# Patient Record
Sex: Male | Born: 1980 | Race: Black or African American | Hispanic: No | Marital: Married | Smoking: Current every day smoker
Health system: Southern US, Community
[De-identification: ages and names within clinical notes are randomized; demographics above are authoritative.]

## PROBLEM LIST (undated history)

## (undated) ENCOUNTER — Ambulatory Visit (HOSPITAL_COMMUNITY): Admission: EM | Payer: No Typology Code available for payment source | Source: Home / Self Care

## (undated) DIAGNOSIS — Z720 Tobacco use: Secondary | ICD-10-CM

## (undated) HISTORY — DX: Tobacco use: Z72.0

---

## 2008-01-04 ENCOUNTER — Emergency Department (HOSPITAL_COMMUNITY): Admission: EM | Admit: 2008-01-04 | Discharge: 2008-01-04 | Payer: Self-pay | Admitting: Emergency Medicine

## 2009-04-10 ENCOUNTER — Emergency Department (HOSPITAL_COMMUNITY): Admission: EM | Admit: 2009-04-10 | Discharge: 2009-04-10 | Payer: Self-pay | Admitting: Family Medicine

## 2009-09-26 ENCOUNTER — Emergency Department (HOSPITAL_COMMUNITY): Admission: EM | Admit: 2009-09-26 | Discharge: 2009-09-26 | Payer: Self-pay | Admitting: Family Medicine

## 2009-10-07 ENCOUNTER — Emergency Department (HOSPITAL_COMMUNITY): Admission: EM | Admit: 2009-10-07 | Discharge: 2009-10-08 | Payer: Self-pay | Admitting: Emergency Medicine

## 2009-12-27 ENCOUNTER — Emergency Department (HOSPITAL_COMMUNITY): Admission: EM | Admit: 2009-12-27 | Discharge: 2009-12-27 | Payer: Self-pay | Admitting: Family Medicine

## 2010-05-06 ENCOUNTER — Emergency Department (HOSPITAL_COMMUNITY): Admission: EM | Admit: 2010-05-06 | Discharge: 2010-05-06 | Payer: Self-pay | Admitting: Family Medicine

## 2010-07-11 ENCOUNTER — Emergency Department (HOSPITAL_COMMUNITY)
Admission: EM | Admit: 2010-07-11 | Discharge: 2010-07-11 | Payer: Self-pay | Source: Home / Self Care | Admitting: Emergency Medicine

## 2010-07-25 ENCOUNTER — Emergency Department (HOSPITAL_COMMUNITY)
Admission: EM | Admit: 2010-07-25 | Discharge: 2010-07-25 | Payer: Self-pay | Source: Home / Self Care | Admitting: Emergency Medicine

## 2010-07-25 LAB — RPR: RPR Ser Ql: NONREACTIVE

## 2010-07-25 LAB — HIV ANTIBODY (ROUTINE TESTING W REFLEX): HIV: NONREACTIVE

## 2010-08-03 ENCOUNTER — Emergency Department (HOSPITAL_COMMUNITY)
Admission: EM | Admit: 2010-08-03 | Discharge: 2010-08-03 | Payer: Self-pay | Source: Home / Self Care | Admitting: Emergency Medicine

## 2010-10-01 ENCOUNTER — Emergency Department (HOSPITAL_COMMUNITY)
Admission: EM | Admit: 2010-10-01 | Discharge: 2010-10-01 | Disposition: A | Discharge status: Home / Self Care | Payer: Self-pay | Attending: Emergency Medicine | Admitting: Emergency Medicine

## 2010-10-01 DIAGNOSIS — R109 Unspecified abdominal pain: Secondary | ICD-10-CM | POA: Insufficient documentation

## 2010-10-03 LAB — CULTURE, ROUTINE-ABSCESS

## 2010-10-08 LAB — RPR: RPR Ser Ql: NONREACTIVE

## 2010-10-08 LAB — HIV ANTIBODY (ROUTINE TESTING W REFLEX): HIV: NONREACTIVE

## 2010-10-08 LAB — GC/CHLAMYDIA PROBE AMP, GENITAL
Chlamydia, DNA Probe: NEGATIVE
GC Probe Amp, Genital: NEGATIVE

## 2010-10-14 LAB — COMPREHENSIVE METABOLIC PANEL
ALT: 12 U/L (ref 0–53)
AST: 16 U/L (ref 0–37)
Albumin: 4.2 g/dL (ref 3.5–5.2)
Alkaline Phosphatase: 43 U/L (ref 39–117)
BUN: 6 mg/dL (ref 6–23)
CO2: 26 mEq/L (ref 19–32)
Calcium: 9.2 mg/dL (ref 8.4–10.5)
Chloride: 104 mEq/L (ref 96–112)
Creatinine, Ser: 0.87 mg/dL (ref 0.4–1.5)
GFR calc Af Amer: >60 mL/min (ref >60)
GFR calc non Af Amer: >60 mL/min (ref >60)
Glucose, Bld: 94 mg/dL (ref 70–99)
Potassium: 3.8 mEq/L (ref 3.5–5.1)
Sodium: 139 mEq/L (ref 135–145)
Total Bilirubin: 0.8 mg/dL (ref 0.3–1.2)
Total Protein: 7.1 g/dL (ref 6.0–8.3)

## 2010-10-14 LAB — LIPASE, BLOOD: Lipase: 14 U/L (ref 11–59)

## 2010-10-14 LAB — POCT CARDIAC MARKERS
CKMB, poc: <1 ng/mL — ABNORMAL LOW (ref 1.0–8.0)
Myoglobin, poc: 44 ng/mL (ref 12–200)
Troponin i, poc: <0.05 ng/mL (ref 0.00–0.09)

## 2010-10-14 LAB — GC/CHLAMYDIA PROBE AMP, GENITAL
Chlamydia, DNA Probe: NEGATIVE
GC Probe Amp, Genital: POSITIVE — AB

## 2010-10-26 LAB — GC/CHLAMYDIA PROBE AMP, GENITAL
Chlamydia, DNA Probe: NEGATIVE
GC Probe Amp, Genital: NEGATIVE

## 2010-10-26 LAB — HIV ANTIBODY (ROUTINE TESTING W REFLEX): HIV: NONREACTIVE

## 2010-10-26 LAB — RPR: RPR Ser Ql: NONREACTIVE

## 2010-12-04 ENCOUNTER — Inpatient Hospital Stay (INDEPENDENT_AMBULATORY_CARE_PROVIDER_SITE_OTHER)
Admission: RE | Admit: 2010-12-04 | Discharge: 2010-12-04 | Disposition: A | Discharge status: Home / Self Care | Payer: Self-pay | Source: Ambulatory Visit | Attending: Family Medicine | Admitting: Family Medicine

## 2010-12-04 DIAGNOSIS — B353 Tinea pedis: Secondary | ICD-10-CM

## 2010-12-04 DIAGNOSIS — L989 Disorder of the skin and subcutaneous tissue, unspecified: Secondary | ICD-10-CM

## 2011-04-18 LAB — INFLUENZA A AND B ANTIGEN (CONVERTED LAB)
Inflenza A Ag: NEGATIVE
Influenza B Ag: NEGATIVE

## 2011-09-23 ENCOUNTER — Encounter (HOSPITAL_COMMUNITY): Payer: Self-pay

## 2011-09-23 ENCOUNTER — Emergency Department (INDEPENDENT_AMBULATORY_CARE_PROVIDER_SITE_OTHER)
Admission: EM | Admit: 2011-09-23 | Discharge: 2011-09-23 | Disposition: A | Discharge status: Home Health | Payer: Self-pay | Source: Home / Self Care | Attending: Emergency Medicine | Admitting: Emergency Medicine

## 2011-09-23 DIAGNOSIS — H0019 Chalazion unspecified eye, unspecified eyelid: Secondary | ICD-10-CM

## 2011-09-23 NOTE — ED Notes (Signed)
C/o swelling to rt upper eye lid since Nov 2012.  Denies pain, redness or drainage.  States both eyes were like this before and he used rx eye drops for conjunctivitis and sx resolved in lt eye.

## 2011-09-23 NOTE — Discharge Instructions (Signed)
Chalazion  A chalazion is a swelling or hard lump on the eyelid caused by a blocked oil gland. Chalazions may occur on the upper or the lower eyelid.    CAUSES    Oil gland in the eyelid becomes blocked.  SYMPTOMS     Swelling or hard lump on the eyelid. This lump may make it hard to see out of the eye.    The swelling may spread to areas around the eye.   TREATMENT     Although some chalazions disappear by themselves in 1 or 2 months, some chalazions may need to be removed.    Medicines to treat an infection may be required.   HOME CARE INSTRUCTIONS     Wash your hands often and dry them with a clean towel. Do not touch the chalazion.    Apply heat to the eyelid several times a day for 10 minutes to help ease discomfort and bring any yellowish white fluid (pus) to the surface. One way to apply heat to a chalazion is to use the handle of a metal spoon.    Hold the handle under hot water until it is hot, and then wrap the handle in paper towels so that the heat can come through without burning your skin.    Hold the wrapped handle against the chalazion and reheat the spoon handle as needed.    Apply heat in this fashion for 10 minutes, 4 times per day.    Return to your caregiver to have the pus removed if it does not break (rupture) on its own.    Do not try to remove the pus yourself by squeezing the chalazion or sticking it with a pin or needle.    Only take over-the-counter or prescription medicines for pain, discomfort, or fever as directed by your caregiver.   SEEK IMMEDIATE MEDICAL CARE IF:     You have pain in your eye.    Your vision changes.    The chalazion does not go away.    The chalazion becomes painful, red, or swollen, grows larger, or does not start to disappear after 2 weeks.   MAKE SURE YOU:     Understand these instructions.    Will watch your condition.    Will get help right away if you are not doing well or get worse.    Document Released: 07/05/2000 Document Revised: 06/27/2011 Document Reviewed: 10/23/2009  [employer] Patient Information 2012 [employer]

## 2011-09-23 NOTE — ED Provider Notes (Signed)
Chief Complaint  Patient presents with  . Eye Problem    History of Present Illness:   Edward Blackburn is a 31 year old male who has had a history of swelling and itching of the upper eyelids since last Thanksgiving. He went to an urgent care and was given some eyedrops. This seemed to get better, however he was left with a lump under his right upper lid. This is not painful or itchy he's had no drainage. His vision seems to be normal.  Review of Systems:  Other than noted above, the patient denies any of the following symptoms: Systemic:  No fever, chills, sweats, fatigue, or weight loss. Eye:  No redness, eye pain, photophobia, discharge, blurred vision, or diplopia. ENT:  No nasal congestion, rhinorrhea, or sore throat. Lymphatic:  No adenopathy. Skin:  No rash or pruritis.  PMFSH:  Past medical history, family history, social history, meds, and allergies were reviewed.  Physical Exam:   Vital signs:  BP 109/71  Pulse 60  Temp(Src) 98.2 F (36.8 C) (Oral)  Resp 16  SpO2 99% General:  Alert and in no distress. Eye:  He has a nodule under his right upper eyelid. There is no inflammation, redness, tenderness, or drainage. The eyeball itself is normal, conjunctiva shows no injection, no drainage, cornea is normal, anterior chambers normal, he has full EOMs, and funduscopic exam is normal.is normal.  ENT:  TMs and canals clear.  Nasal mucosa normal.  No intra-oral lesions, mucous membranes moist, pharynx clear. Neck:  No adenopathy tenderness or mass. Skin:  Clear, warm and dry.  Assessment:   Diagnoses that have been ruled out:  None  Diagnoses that are still under consideration:  None  Final diagnoses:  Chalazion    Plan:   1.  The following meds were prescribed:   New Prescriptions   No medications on file   2.  The patient was instructed in symptomatic care and handouts were given. 3.  The patient was told to return if becoming worse in any way, if no better in 3 or 4 days,  and given some red flag symptoms that would indicate earlier return.  Follow up:  The patient was told to follow up with Dr. Gwen Pounds for definitive treatment. In the meantime he can use moist warm compresses.      Roque Lias, MD 09/23/11 [phone]

## 2014-04-21 ENCOUNTER — Encounter (HOSPITAL_COMMUNITY): Payer: Self-pay | Admitting: Emergency Medicine

## 2014-04-21 ENCOUNTER — Emergency Department (INDEPENDENT_AMBULATORY_CARE_PROVIDER_SITE_OTHER)
Admission: EM | Admit: 2014-04-21 | Discharge: 2014-04-21 | Disposition: A | Discharge status: Home / Self Care | Payer: Self-pay | Source: Home / Self Care

## 2014-04-21 DIAGNOSIS — B353 Tinea pedis: Secondary | ICD-10-CM

## 2014-04-21 MED ORDER — KETOCONAZOLE 2 % EX CREA: 1.0000 "application " | TOPICAL_CREAM | Freq: Every day | CUTANEOUS | Status: DC

## 2014-04-21 MED ORDER — TERBINAFINE HCL 250 MG PO TABS: 250.0000 mg | ORAL_TABLET | Freq: Every day | ORAL | Status: DC

## 2014-04-21 NOTE — ED Provider Notes (Signed)
CSN: [id number]     Arrival date & time 04/21/14  1712 History   None    Chief Complaint  Patient presents with  . Foot Pain    bilateral irritation and mild pain   (Consider location/radiation/quality/duration/timing/severity/associated sxs/prior Treatment) Patient is a [age] y.o. male presenting with lower extremity pain and rash.  Foot Pain  Rash Location:  Foot Foot rash location:  R foot and L foot Quality: blistering, dryness, itchiness, peeling and scaling   Severity:  Mild Duration:  2 months Chronicity:  New Context comment:  Feet constantly  wet otj of construction,  Ineffective treatments:  Anti-fungal cream Associated symptoms: no fever     History reviewed. No pertinent past medical history. History reviewed. No pertinent past surgical history. History reviewed. No pertinent family history. History  Substance Use Topics  . Smoking status: Current Every Day Smoker  . Smokeless tobacco: Not on file  . Alcohol Use: Yes    Review of Systems  Constitutional: Negative.  Negative for fever.  Skin: Positive for rash.    Allergies  Review of patient's allergies indicates no known allergies.  Home Medications   Prior to Admission medications   Medication Sig Start Date End Date Taking? Authorizing Provider  ketoconazole (NIZORAL) 2 % cream Apply 1 application topically daily. At bedtime. 04/21/14   Linna HoffJames D Kindl, MD  terbinafine (LAMISIL) 250 MG tablet Take 1 tablet (250 mg total) by mouth daily. 04/21/14   Linna HoffJames D Kindl, MD   BP 134/82  Pulse 68  Temp(Src) 98.8 F (37.1 C) (Oral)  Resp 16  SpO2 98% Physical Exam  Nursing note and vitals reviewed. Constitutional: He is oriented to person, place, and time. He appears well-developed and well-nourished.  Neurological: He is alert and oriented to person, place, and time.  Skin: Skin is warm and dry. Rash noted.  Peeling dermatitis of both feet .    ED Course  Procedures (including critical care time) Labs  Review Labs Reviewed - No data to display  Imaging Review No results found.   MDM   1. Tinea pedis of both feet        Linna HoffJames D Kindl, MD 04/21/14 [id number]

## 2014-04-21 NOTE — ED Notes (Signed)
Pt soaking feet will discharge at 6:30 p.m.  Mw, cma

## 2014-04-21 NOTE — ED Notes (Signed)
Reports bilateral foot irritation and pain.  States " at work my feet stay wet constantly.  Having peeling of skin, irritation and mild pain".  No relief with foot cream.

## 2015-07-23 ENCOUNTER — Emergency Department (HOSPITAL_COMMUNITY): Payer: Self-pay

## 2015-07-23 ENCOUNTER — Emergency Department (HOSPITAL_COMMUNITY)
Admission: EM | Admit: 2015-07-23 | Discharge: 2015-07-23 | Disposition: A | Discharge status: Home / Self Care | Payer: Self-pay | Attending: Emergency Medicine | Admitting: Emergency Medicine

## 2015-07-23 ENCOUNTER — Encounter (HOSPITAL_COMMUNITY): Payer: Self-pay | Admitting: Nurse Practitioner

## 2015-07-23 DIAGNOSIS — J111 Influenza due to unidentified influenza virus with other respiratory manifestations: Secondary | ICD-10-CM | POA: Insufficient documentation

## 2015-07-23 DIAGNOSIS — R69 Illness, unspecified: Secondary | ICD-10-CM

## 2015-07-23 DIAGNOSIS — F17200 Nicotine dependence, unspecified, uncomplicated: Secondary | ICD-10-CM | POA: Insufficient documentation

## 2015-07-23 DIAGNOSIS — Z79899 Other long term (current) drug therapy: Secondary | ICD-10-CM | POA: Insufficient documentation

## 2015-07-23 MED ORDER — HYDROCODONE-ACETAMINOPHEN 5-325 MG PO TABS: ORAL_TABLET | ORAL | Status: DC

## 2015-07-23 MED ORDER — ACETAMINOPHEN 325 MG PO TABS
650.0000 mg | ORAL_TABLET | Freq: Once | ORAL | Status: AC | PRN
  Administered 2015-07-23: 650 mg via ORAL

## 2015-07-23 MED ORDER — ACETAMINOPHEN 325 MG PO TABS
ORAL_TABLET | ORAL | Status: AC
  Filled 2015-07-23: qty 2

## 2015-07-23 NOTE — ED Notes (Addendum)
He c/o cough, chest and nasal congestion, body aches and chills increasingly worse over past couple days. Tried delsym with minimal relief. He is A&Ox4 and breathing easily

## 2015-07-23 NOTE — ED Provider Notes (Signed)
CSN: [id number]     Arrival date & time 07/23/15  1849 History   First MD Initiated Contact with Patient 07/23/15 1913     Chief Complaint  Patient presents with  . URI     (Consider location/radiation/quality/duration/timing/severity/associated sxs/prior Treatment) HPI   Blood pressure 135/76, pulse 91, temperature 99.6 F (37.6 C), temperature source Oral, resp. rate 18, height 6' (1.829 m), weight 92.216 kg, SpO2 98 %.  Edward Blackburn is a 35 y.o. male complaining of intermittently productive cough, nasal congestion, myalgia, sore throat, nausea with no emesis. Started 2 days ago. Patient denies focal chest pain, shortness of breath, abdominal pain, change in bowel or bladder habits. On review of systems he endorses chills and decreased by mouth intake. Has not had a flu shot this year. He denies history of diabetes, asthma.   History reviewed. No pertinent past medical history. History reviewed. No pertinent past surgical history. History reviewed. No pertinent family history. Social History  Substance Use Topics  . Smoking status: Current Every Day Smoker  . Smokeless tobacco: None  . Alcohol Use: Yes    Review of Systems  10 systems reviewed and found to be negative, except as noted in the HPI.   Allergies  Review of patient's allergies indicates no known allergies.  Home Medications   Prior to Admission medications   Medication Sig Start Date End Date Taking? Authorizing Provider  HYDROcodone-acetaminophen (NORCO/VICODIN) 5-325 MG tablet Take 1-2 tablets by mouth every 6 hours as needed for pain and/or cough. 07/23/15   Lyal Husted, PA-C  ketoconazole (NIZORAL) 2 % cream Apply 1 application topically daily. At bedtime. 04/21/14   Linna HoffJames D Kindl, MD  terbinafine (LAMISIL) 250 MG tablet Take 1 tablet (250 mg total) by mouth daily. 04/21/14   Linna HoffJames D Kindl, MD   BP 135/76 mmHg  Pulse 91  Temp(Src) 99.6 F (37.6 C) (Oral)  Resp 18  Ht 6' (1.829 m)  Wt 92.216 kg  BMI  27.57 kg/m2  SpO2 98% Physical Exam  Constitutional: He is oriented to person, place, and time. He appears well-developed and well-nourished. No distress.  HENT:  Head: Normocephalic and atraumatic.  Mouth/Throat: Oropharynx is clear and moist.  No drooling or stridor. Posterior pharynx mildly erythematous no significant tonsillar hypertrophy. No exudate. Soft palate rises symmetrically. No TTP or induration under tongue.   No tenderness to palpation of frontal or bilateral maxillary sinuses.  No mucosal edema in the nares.  Bilateral tympanic membranes with normal architecture and good light reflex.    Eyes: Conjunctivae and EOM are normal. Pupils are equal, round, and reactive to light.  Neck: Normal range of motion.  Cardiovascular: Normal rate, regular rhythm and intact distal pulses.   Pulmonary/Chest: Effort normal and breath sounds normal.  Abdominal: Soft. There is no tenderness.  Musculoskeletal: Normal range of motion.  Neurological: He is alert and oriented to person, place, and time.  Skin: He is not diaphoretic.  Psychiatric: He has a normal mood and affect.  Nursing note and vitals reviewed.   ED Course  Procedures (including critical care time) Labs Review Labs Reviewed - No data to display  Imaging Review Dg Chest 2 View  07/23/2015  CLINICAL DATA:  Cough and fever. Left-sided chest pain and shortness of breath. Symptoms for 1 day. EXAM: CHEST  2 VIEW COMPARISON:  10/07/2009 FINDINGS: Right basilar scarring is unchanged from prior exam. The cardiomediastinal contours are normal. Pulmonary vasculature is normal. No consolidation, pleural effusion, or pneumothorax. No acute  osseous abnormalities are seen. IMPRESSION: No acute pulmonary process. Electronically Signed   By: Rubye Oaks M.D.   On: 07/23/2015 19:54   I have personally reviewed and evaluated these images and lab results as part of my medical decision-making.   EKG Interpretation None      MDM    Final diagnoses:  Influenza-like illness    Filed Vitals:   07/23/15 1900 07/23/15 1903 07/23/15 2014  BP:  117/76 135/76  Pulse:  103 91  Temp:  101.2 F (38.4 C) 99.6 F (37.6 C)  TempSrc:  Oral Oral  Resp:  16 18  Height: 6' (1.829 m)    Weight: 92.216 kg    SpO2:  95% 98%    Medications  acetaminophen (TYLENOL) 325 MG tablet (not administered)  acetaminophen (TYLENOL) tablet 650 mg (650 mg Oral Given 07/23/15 1906)    Edward Blackburn is 35 y.o. male presenting with fever, chills, cough, myalgia, sore throat. Patient is febrile to 100.1, mild tachycardia of 103. He appears clinically well-hydrated. He is not vomiting. Chest x-rays without infiltrate. We have discussed the risks and benefits of Tamiflu and in shared decision-making he declines. Advised patient aggressive hydration, Vicodin for pain control and cough suppression. Discussed return precautions and how to administer antipyretics. Work note provided.  Evaluation does not show pathology that would require ongoing emergent intervention or inpatient treatment. Pt is hemodynamically stable and mentating appropriately. Discussed findings and plan with patient/guardian, who agrees with care plan. All questions answered. Return precautions discussed and outpatient follow up given.   Discharge Medication List as of 07/23/2015  8:12 PM    START taking these medications   Details  HYDROcodone-acetaminophen (NORCO/VICODIN) 5-325 MG tablet Take 1-2 tablets by mouth every 6 hours as needed for pain and/or cough., [employer], PA-C 07/23/15 1610  Derwood Kaplan, MD 07/26/15 [phone]

## 2015-07-23 NOTE — ED Notes (Signed)
Patient transported to X-ray 

## 2015-07-23 NOTE — Discharge Instructions (Signed)
Return to the emergency room for any worsening or concerning symptoms including fast breathing, heart racing, confusion, vomiting.  Rest, cover your mouth when you cough and wash your hands frequently.   Push fluids: water or Gatorade, do not drink any soda, juice or caffeinated beverages.  For fever and pain control you can take Motrin (ibuprofen) as follows: 400 mg (this is normally 2 over the counter pills) every 4 hours with food.  Do not return to work until 2 day after your fever breaks.   Take Vicodin for cough and pain control, do not drink alcohol, drive, care for children or do other critical tasks while taking Vicodin     Do not hesitate to return to the emergency room for any new, worsening or concerning symptoms.  Please obtain primary care using resource guide below. Let them know that you were seen in the emergency room and that they will need to obtain records for further outpatient management.   Influenza, Adult Influenza ("the flu") is a viral infection of the respiratory tract. It occurs more often in winter months because people spend more time in close contact with one another. Influenza can make you feel very sick. Influenza easily spreads from person to person (contagious). CAUSES  Influenza is caused by a virus that infects the respiratory tract. You can catch the virus by breathing in droplets from an infected person's cough or sneeze. You can also catch the virus by touching something that was recently contaminated with the virus and then touching your mouth, nose, or eyes. RISKS AND COMPLICATIONS You may be at risk for a more severe case of influenza if you smoke cigarettes, have diabetes, have chronic heart disease (such as heart failure) or lung disease (such as asthma), or if you have a weakened immune system. Elderly people and pregnant women are also at risk for more serious infections. The most common problem of influenza is a lung infection (pneumonia).  Sometimes, this problem can require emergency medical care and may be life threatening. SIGNS AND SYMPTOMS  Symptoms typically last 4 to 10 days and may include:  Fever.  Chills.  Headache, body aches, and muscle aches.  Sore throat.  Chest discomfort and cough.  Poor appetite.  Weakness or feeling tired.  Dizziness.  Nausea or vomiting. DIAGNOSIS  Diagnosis of influenza is often made based on your history and a physical exam. A nose or throat swab test can be done to confirm the diagnosis. TREATMENT  In mild cases, influenza goes away on its own. Treatment is directed at relieving symptoms. For more severe cases, your health care provider may prescribe antiviral medicines to shorten the sickness. Antibiotic medicines are not effective because the infection is caused by a virus, not by bacteria. HOME CARE INSTRUCTIONS  Take medicines only as directed by your health care provider.  Use a cool mist humidifier to make breathing easier.  Get plenty of rest until your temperature returns to normal. This usually takes 3 to 4 days.  Drink enough fluid to keep your urine clear or pale yellow.  Cover yourmouth and nosewhen coughing or sneezing,and wash your handswellto prevent thevirusfrom spreading.  Stay homefromwork orschool untilthe fever is gonefor at least 421full day. PREVENTION  An annual influenza vaccination (flu shot) is the best way to avoid getting influenza. An annual flu shot is now routinely recommended for all adults in the [place] SEEK MEDICAL CARE IF:  You experiencechest pain, yourcough worsens,or you producemore mucus.  Youhave nausea,vomiting, ordiarrhea.  Your fever returns or gets worse. SEEK IMMEDIATE MEDICAL CARE IF:  You havetrouble breathing, you become short of breath,or your skin ornails becomebluish.  You have severe painor stiffnessin the neck.  You develop a sudden headache, or pain in the face or ear.  You have  nausea or vomiting that you cannot control. MAKE SURE YOU:   Understand these instructions.  Will watch your condition.  Will get help right away if you are not doing well or get worse.   This information is not intended to replace advice given to you by your health care provider. Make sure you discuss any questions you have with your health care provider.   Document Released: 07/05/2000 Document Revised: 07/29/2014 Document Reviewed: 10/07/2011 Elsevier Interactive Patient Education 2016 [employer].   Emergency Department Resource Guide 1) Find a Doctor and Pay Out of Pocket Although you won't have to find out who is covered by your insurance plan, it is a good idea to ask around and get recommendations. You will then need to call the office and see if the doctor you have chosen will accept you as a new patient and what types of options they offer for patients who are self-pay. Some doctors offer discounts or will set up payment plans for their patients who do not have insurance, but you will need to ask so you aren't surprised when you get to your appointment.  2) Contact Your Local Health Department Not all health departments have doctors that can see patients for sick visits, but many do, so it is worth a call to see if yours does. If you don't know where your local health department is, you can check in your phone book. The CDC also has a tool to help you locate your state's health department, and many state websites also have listings of all of their local health departments.  3) Find a Walk-in Clinic If your illness is not likely to be very severe or complicated, you may want to try a walk in clinic. These are popping up all over the country in pharmacies, drugstores, and shopping centers. They're usually staffed by nurse practitioners or physician assistants that have been trained to treat common illnesses and complaints. They're usually fairly quick and inexpensive. However, if you  have serious medical issues or chronic medical problems, these are probably not your best option.  No Primary Care Doctor: - Call Health Connect at  [phone] - they can help you locate a primary care doctor that  accepts your insurance, provides certain services, etc. - Physician Referral Service- [phone]  Chronic Pain Problems: Organization         Address  Phone   Notes  [facility]  [phone] Patients need to be referred by their primary care doctor.   Medication Assistance: Organization         Address  Phone   Notes  [facility] Medication [facility] [address] [place] [place], [place] [address] [phone] --Must be a resident of [facility] -- Must have NO insurance coverage whatsoever (no Medicaid/ Medicare, etc.) -- The pt. MUST have a primary care doctor that directs their care regularly and follows them in the community   [facility]ist  [phone]   [employer]  [phone]    Agencies that provide inexpensive medical care: Organization         Address  Phone   Notes  [facility]  [phone]  [facility]    [phone]   [facility] [address] [place], [place] [address] [phone]   [facility] [place] [address] [place] [place] [phone]   Planned Parenthood    [phone]   [facility]    [phone]   Community Health and [facility]  [address], [place] Phone:  [phone], Fax:  [phone] Hours of Operation:  9 am - 6 pm, M-F.  Also accepts Medicaid/Medicare and self-pay.  [facility]  [address], [place] Phone: [phone], Fax: [phone]. Hours of Operation:  8:30 am - 5:30 pm, M-F.  Also accepts Medicaid and self-pay.  [facility] [place] [address], [place] Phone: [phone]   [facility] [address]  Natasha Bence [place], [place] [phone], Ext. [phone] Mondays & Thursdays: 7-9 AM.  First 15 patients are seen on a first come, first serve basis.    Medicaid-accepting [facility] Providers:  Organization         Address  Phone   Notes  [facility] [address], [place] [phone] Also accepts self-pay patients.  [facility] [address] Laurell Josephs [place], [place]  [phone]   [facility] [address], [place] [phone]   [facility] [address], [place] [phone]   Renaye Rakers [address], [place]   [phone] Only accepts [place] Access [place] patients after they have their name applied to their card.   Self-Pay (no insurance) in [facility]:  Organization         Address  Phone   Notes  Sickle Cell Patients, [facility] [address] [place], [place] [phone]   [facility] [address] [place], [place] [phone]   [facility] [place]  [address], [place] [phone]   [facility]/Dr. Osei-Bonsu  [address] [place] or [address], [place] [phone] Phone number for both [place] and [place] locations is the same.  Urgent Medical and [facility] [address], [place] [phone]   [facility] [address] [place] or [address] [phone]   [facility] [address] [place] [phone], phone; [phone], fax Sees patients 1st and 3rd Saturday of every month.  Must not qualify for public or private insurance (i.e. Medicaid, Medicare, [place] Health Choice, Veterans' Benefits)  Household income should be no more than 200% of the poverty level The clinic cannot treat you if you are pregnant or think you are pregnant   Sexually transmitted diseases are not treated at the clinic.    Dental Care: Organization         Address  Phone  Notes  [facility] Department of [facility] [address] [place], [place] [phone] Accepts children up to age 31 who are enrolled in [place] or [place] Health Choice; pregnant women with a Medicaid card; and children who have applied for Medicaid or [place] Health Choice, but were declined, whose parents can pay a reduced fee at time of service.  [facility] of [facility]  [address],  [place] [phone] Accepts children up to age 24 who are enrolled in Medicaid or [place] Health Choice; pregnant women with a Medicaid card; and children who have applied for Medicaid or [place] Health Choice, but were declined, whose parents can pay a reduced fee at time of service.  [place] Adult Dental Access PROGRAM  [place] [phone] Patients are seen by appointment only. Walk-ins are not accepted. [place] will see patients 57 years of age and older. Monday - Tuesday (8am-5pm) Most Wednesdays (8:30-5pm) $30 per visit, cash only  [facility] Adult Dental Access PROGRAM  [address], [facility] [phone] Patients are seen by appointment only. Walk-ins are not accepted. [place] will see patients 53 years of age and older. One Wednesday Evening (Monthly: Volunteer Based).  $30 per visit, cash only  [place]  [phone] for adults; Children under age 35, call Graduate Pediatric Dentistry at [phone]. Children aged 86-14, please call [phone] to request a pediatric application.  Dental services are provided in all areas of dental care including fillings, crowns and bridges, complete and partial dentures, implants, gum treatment, root canals, and extractions. Preventive care is also provided. Treatment is provided to both adults and children. Patients  are selected via a lottery and there is often a waiting list.   [facility] [address], [place]  [phone] [email or web address]   [facility] [address] [place], [place] [phone], Ext. [phone] Second and Fourth Thursday of each month, opens at 6:30 AM; Clinic ends at 9 AM.  Patients are seen on a first-come first-served basis, and a limited number are seen during each clinic.   [facility]  [address] Hillard Danker [place], [place] [phone]   Eligibility Requirements You must have lived in [place], [place], or [place] counties for at least the last three months.   You cannot be eligible for state or federal sponsored [employer], including [employer], [place], or [employer].   You generally cannot be eligible for healthcare insurance through your employer.    How to apply: Eligibility screenings are held every Tuesday and Wednesday afternoon from 1:00 pm until 4:00 pm. You do not need an appointment for the interview!  [facility] [address] [place], [place]   [place]  [place] Department  [place]  [phone]    Behavioral Health Resources in the Community: Intensive Outpatient Programs Organization         Address  Phone  Notes  Joseph [place]. [address] [place], [place] [phone]   [facility] [address], [place], [place]   ADS: [facility] Drug [facility] [address], [place], [place]   [place] [address]  [place], [place] or [phone]   Substance Abuse Resources Organization         Address  Phone  Notes  Alcohol and Drug Services  [phone]   [place]  [phone]   [facility]   Chinita Pester  [phone]    Residential & Outpatient Substance Abuse Program  [phone]   Psychological Services Organization         Address  Phone  Notes  [place]  336-  [place] [address] [place] or [phone]    Mobile Crisis Teams Organization         Address  Phone  Notes  [facility], Mobile Crisis Care Unit  [phone]   [facility]  [address] [place], [place]   [place] [address], [place] [phone]    Self-Help/Support Groups Organization         Address  Phone             Notes  [place]. of [place] - variety of support groups  [place] Call for more information  Narcotics Anonymous (NA), [facility] [address], [employer] [place]  2 meetings at this location   Special educational needs teacher         Address  Phone  Notes  [facility] Residential Treatment [place],    [place]  [phone]   [facility]  [address], [place] [phone], [place], [place]   [place], [place] [phone] Admissions: 8am-3pm M-F  Incentives Substance [place] [address]    [place], [place] [phone]   [facility] [address] [place], [place], [place]   [facility] [address],  [place], [place]   [facility]ight Programs - Intensive Outpatient [address] Kristeen Mans [address], [place], [place]   [facility] ([place].,  [place], [place] [phone] or [phone]   [facility] ([facility]) [address], [place], [place] Accepts Medicaid  [facility] [address]  [place] [phone] Substance Abuse/Addiction Treatment   [facility] Organization         Address  Phone  Notes  [facility]  [phone]   Domenic Schwab, PhD [address] Arlis Porta [place], [place]   [phone] or [phone]   [place] [address], [place] [phone]   [facility] [address], [place], [place] [phone] Insurance/Medicaid/sponsorship through [facility] and Families [address]                                    [place], [place] [phone] [place] [address], [place] [phone]    Dr. Adele Schilder  [phone]   [facility] Dept. 1) [address] [place] 2) [place] 3)  [place] [address], [place] [phone]  [phone]   [place] [phone] or [phone] (After Hours)

## 2015-11-06 ENCOUNTER — Encounter (HOSPITAL_COMMUNITY): Payer: Self-pay | Admitting: Emergency Medicine

## 2015-11-06 ENCOUNTER — Ambulatory Visit (HOSPITAL_COMMUNITY)
Admission: EM | Admit: 2015-11-06 | Discharge: 2015-11-06 | Disposition: A | Discharge status: Home / Self Care | Payer: No Typology Code available for payment source | Attending: Family Medicine | Admitting: Family Medicine

## 2015-11-06 DIAGNOSIS — S76912A Strain of unspecified muscles, fascia and tendons at thigh level, left thigh, initial encounter: Secondary | ICD-10-CM | POA: Diagnosis not present

## 2015-11-06 DIAGNOSIS — S29012A Strain of muscle and tendon of back wall of thorax, initial encounter: Secondary | ICD-10-CM

## 2015-11-06 DIAGNOSIS — S161XXA Strain of muscle, fascia and tendon at neck level, initial encounter: Secondary | ICD-10-CM

## 2015-11-06 DIAGNOSIS — S233XXA Sprain of ligaments of thoracic spine, initial encounter: Secondary | ICD-10-CM | POA: Diagnosis not present

## 2015-11-06 MED ORDER — DICLOFENAC POTASSIUM 50 MG PO TABS: 50.0000 mg | ORAL_TABLET | Freq: Three times a day (TID) | ORAL | Status: DC

## 2015-11-06 MED ORDER — TRAMADOL HCL 50 MG PO TABS: 50.0000 mg | ORAL_TABLET | Freq: Four times a day (QID) | ORAL | Status: DC | PRN
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
## 2016-11-25 ENCOUNTER — Encounter (HOSPITAL_COMMUNITY): Payer: Self-pay | Admitting: *Deleted

## 2016-11-25 ENCOUNTER — Ambulatory Visit (HOSPITAL_COMMUNITY)
Admission: EM | Admit: 2016-11-25 | Discharge: 2016-11-25 | Disposition: A | Discharge status: Home / Self Care | Payer: 59 | Attending: Internal Medicine | Admitting: Internal Medicine

## 2016-11-25 DIAGNOSIS — R69 Illness, unspecified: Secondary | ICD-10-CM | POA: Diagnosis not present

## 2016-11-25 DIAGNOSIS — J111 Influenza due to unidentified influenza virus with other respiratory manifestations: Secondary | ICD-10-CM

## 2016-11-25 MED ORDER — OSELTAMIVIR PHOSPHATE 75 MG PO CAPS: 75.0000 mg | ORAL_CAPSULE | Freq: Two times a day (BID) | ORAL | 0 refills | Status: DC

## 2016-11-25 MED ORDER — PREDNISONE 50 MG PO TABS: 50.0000 mg | ORAL_TABLET | Freq: Every day | ORAL | 0 refills | Status: DC

## 2016-11-25 NOTE — ED Triage Notes (Signed)
Pt  Reports    Symptoms  Of  Congestion   As   Well   pt  Reports  Had   Chills as   Well  Last  Pm  He  Reports  Symptoms  Not  releived   By  otc    meds

## 2016-11-25 NOTE — Discharge Instructions (Addendum)
Prescriptions for tamiflu (oseltamivir) and prednisone sent to the [facility] on [address].  Anticipate gradual improvement in well-being over the next several days.  Recheck for new fever >100.5, increasing phlegm production/nasal discharge, or if not starting to improve in a few days.

## 2016-11-25 NOTE — ED Provider Notes (Signed)
[facility]    CSN: [id number] Arrival date & time: 11/25/16  0957     History   Chief Complaint Chief Complaint  Patient presents with  . URI    HPI Edward Blackburn is a 36 y.o. male. 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 He had a little bit of sore throat beginning Saturday evening. Yesterday, he had the abrupt onset of severe chills, achiness, headache. Cough, runny/congested nose. Little bit of diarrhea, nausea. Malaise, had to call out of work today.  HPI  History reviewed. No pertinent past medical history.  There are no active problems to display for this patient.   History reviewed. No pertinent surgical history.     Home Medications    Prior to Admission medications   Medication Sig Start Date End Date Taking? Authorizing Provider  oseltamivir (TAMIFLU) 75 MG capsule Take 1 capsule (75 mg total) by mouth every 12 (twelve) hours. 11/25/16   Eustace Moore, MD  predniSONE (DELTASONE) 50 MG tablet Take 1 tablet (50 mg total) by mouth daily. 11/25/16   Eustace Moore, MD    Family History No family history on file.  Social History Social History  Substance Use Topics  . Smoking status: Current Every Day Smoker  . Smokeless tobacco: Never Used  . Alcohol use Yes     Allergies   Patient has no known allergies.   Review of Systems Review of Systems  All other systems reviewed and are negative.    Physical Exam Triage Vital Signs ED Triage Vitals  Enc Vitals Group     BP 11/25/16 1020 132/78     Pulse Rate 11/25/16 1020 78     Resp 11/25/16 1020 18     Temp 11/25/16 1020 98.6 F (37 C)     Temp Source 11/25/16 1020 Oral     SpO2 11/25/16 1020 99 %     Weight --      Height --      Pain Score 11/25/16 1022 3     Pain Loc --    Updated Vital Signs BP 132/78 (BP Location: Right Arm)   Pulse 78   Temp 98.6 F (37 C) (Oral)   Resp 18   SpO2 99%   Physical Exam  Constitutional: He is oriented to person, place, and time.    Alert, nicely groomed Looks ill but not toxic  HENT:  Head: Atraumatic.  Sneezing and coughing during exam Both ears are waxy, left TM is injected, mildly dull Right TM is mildly dull Severe nasal congestion with mucopurulent material present Throat is slightly injected  Eyes:  Conjugate gaze, no eye redness/drainage  Neck: Neck supple.  Cardiovascular: Normal rate and regular rhythm.   Pulmonary/Chest: No respiratory distress. He has no wheezes. He has no rales.  Lungs clear, symmetric breath sounds  Abdominal: He exhibits no distension.  Musculoskeletal: Normal range of motion.  Neurological: He is alert and oriented to person, place, and time.  Skin: Skin is warm and dry.  No cyanosis  Nursing note and vitals reviewed.    UC Treatments / Results   Procedures Procedures (including critical care time) None today  Final Clinical Impressions(s) / UC Diagnoses   Final diagnoses:  Influenza-like illness   Prescriptions for tamiflu (oseltamivir) and prednisone [facility] on [address]  Anticipate gradual improvement in well-being over the next several days.  Recheck for new fever >100.5, increasing phlegm production/nasal discharge, or if not  starting to improve in a few days.    New Prescriptions New Prescriptions   OSELTAMIVIR (TAMIFLU) 75 MG CAPSULE    Take 1 capsule (75 mg total) by mouth every 12 (twelve) hours.   PREDNISONE (DELTASONE) 50 MG TABLET    Take 1 tablet (50 mg total) by mouth daily.     Eustace MooreMurray, Kandyce Dieguez W, MD 11/26/16 2245

## 2016-12-04 ENCOUNTER — Encounter (HOSPITAL_COMMUNITY): Payer: Self-pay | Admitting: Family Medicine

## 2016-12-04 ENCOUNTER — Ambulatory Visit (HOSPITAL_COMMUNITY)
Admission: EM | Admit: 2016-12-04 | Discharge: 2016-12-04 | Disposition: A | Discharge status: Home / Self Care | Payer: 59 | Attending: Family Medicine | Admitting: Family Medicine

## 2016-12-04 DIAGNOSIS — M5430 Sciatica, unspecified side: Secondary | ICD-10-CM

## 2016-12-04 MED ORDER — PREDNISONE 20 MG PO TABS: ORAL_TABLET | ORAL | 0 refills | Status: DC

## 2016-12-04 NOTE — Discharge Instructions (Signed)
Remember to do year exercises every morning: Hip raises, plank, knee to chest

## 2016-12-04 NOTE — ED Triage Notes (Signed)
Pt here for lower back pain that started Monday at work. sts that he was bending over to picl something up and felt the pain.

## 2016-12-04 NOTE — ED Provider Notes (Signed)
[facility]    CSN: [id number] Arrival date & time: 12/04/16  1000     History   Chief Complaint Chief Complaint  Patient presents with  . Back Pain    HPI Edward Blackburn is a 36 y.o. male.   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 About a week and a half ago he has some twinges in his low back which resolved on its own.  On Monday he bent over to pick up some pliers of the ground and experienced the same pain again. This time the pain was worse and is persisted. He's tried massage and hot baths but this has not helped. He has some radiation of pain down his back.  Significant negatives include no fever and no weakness in the lower extremity, no radicular pain and telemetry bends over, and no difficulty with urination.      History reviewed. No pertinent past medical history.  There are no active problems to display for this patient.   History reviewed. No pertinent surgical history.     Home Medications    Prior to Admission medications   Medication Sig Start Date End Date Taking? Authorizing Provider  predniSONE (DELTASONE) 20 MG tablet Two daily with food 12/04/16   Elvina SidleLauenstein, Isidoro Santillana, MD    Family History History reviewed. No pertinent family history.  Social History Social History  Substance Use Topics  . Smoking status: Current Every Day Smoker  . Smokeless tobacco: Never Used  . Alcohol use Yes     Allergies   Patient has no known allergies.   Review of Systems Review of Systems  Constitutional: Positive for activity change.  HENT: Negative.   Eyes: Negative.   Respiratory: Negative.   Gastrointestinal: Negative.   Genitourinary: Negative.   Musculoskeletal: Positive for back pain.     Physical Exam Triage Vital Signs ED Triage Vitals  Enc Vitals Group     BP 12/04/16 1016 117/83     Pulse Rate 12/04/16 1016 99     Resp 12/04/16 1016 18     Temp 12/04/16 1016 98.3 F  (36.8 C)     Temp src --      SpO2 12/04/16 1016 95 %     Weight --      Height --      Head Circumference --      Peak Flow --      Pain Score 12/04/16 1015 7     Pain Loc --      Pain Edu? --      Excl. in GC? --    No data found.   Updated Vital Signs BP 117/83   Pulse 99   Temp 98.3 F (36.8 C)   Resp 18   SpO2 95%    Physical Exam  Constitutional: He is oriented to person, place, and time. He appears well-developed and well-nourished.  HENT:  Head: Normocephalic.  Right Ear: External ear normal.  Left Ear: External ear normal.  Mouth/Throat: Oropharynx is clear and moist.  Eyes: Conjunctivae are normal. Pupils are equal, round, and reactive to light.  Neck: Normal range of motion. Neck supple.  Cardiovascular: Normal rate.   Pulmonary/Chest: Effort normal.  Musculoskeletal: Normal range of motion. He exhibits no tenderness or deformity.  Neurological: He is alert and oriented to person, place, and time.  Positive straight leg raising on both sides at about 80.  Skin: Skin is warm and dry.  Nursing note and vitals reviewed.    UC Treatments / Results  Labs (all labs ordered are listed, but only abnormal results are displayed) Labs Reviewed - No data to display  EKG  EKG Interpretation None       Radiology No results found.  Procedures Procedures (including critical care time)  Medications Ordered in UC Medications - No data to display   Initial Impression / Assessment and Plan / UC Course  I have reviewed the triage vital signs and the nursing notes.  Pertinent labs & imaging results that were available during my care of the patient were reviewed by me and considered in my medical decision making (see chart for details).     Final Clinical Impressions(s) / UC Diagnoses   Final diagnoses:  Sciatica, unspecified laterality    New Prescriptions New Prescriptions   PREDNISONE (DELTASONE) 20 MG TABLET    Two daily with food       Elvina Sidle, MD 12/04/16 1037

## 2018-02-02 ENCOUNTER — Encounter (HOSPITAL_COMMUNITY): Payer: Self-pay | Admitting: Emergency Medicine

## 2018-02-02 ENCOUNTER — Ambulatory Visit (HOSPITAL_COMMUNITY)
Admission: EM | Admit: 2018-02-02 | Discharge: 2018-02-02 | Disposition: A | Discharge status: Home / Self Care | Payer: 59 | Attending: Family Medicine | Admitting: Family Medicine

## 2018-02-02 DIAGNOSIS — M5442 Lumbago with sciatica, left side: Secondary | ICD-10-CM

## 2018-02-02 MED ORDER — PREDNISONE 20 MG PO TABS: 40.0000 mg | ORAL_TABLET | Freq: Every day | ORAL | 0 refills | Status: AC

## 2018-02-02 MED ORDER — CYCLOBENZAPRINE HCL 5 MG PO TABS: 5.0000 mg | ORAL_TABLET | Freq: Every day | ORAL | 0 refills | Status: DC

## 2018-02-02 MED ORDER — NAPROXEN 500 MG PO TABS: 500.0000 mg | ORAL_TABLET | Freq: Two times a day (BID) | ORAL | 0 refills | Status: DC

## 2018-02-02 NOTE — ED Triage Notes (Signed)
Pt has hx with back pain, pt states he has a slipped disc in his back, states "this is about the third time i've had it and its been awful since yesterday".

## 2018-02-02 NOTE — ED Provider Notes (Signed)
[facility]    CSN: [id number] Arrival date & time: 02/02/18  1053     History   Chief Complaint Chief Complaint  Patient presents with  . Back Pain    HPI Edward Blackburn is a 37 y.o. male.   Truth presents with complaints of low back pain after bending a lifting two days ago. States has had similar in the past. Pain at midline but also to left back and into left buttocks. No numbness or tingling. States sometimes historically will feel his legs go numb if he is walking up an incline. No back trauma. No urinary or stool incontinence. No fevers. Pain 8/10. Has not taken any medications for pain. Does not have a PCP. Pain is sharp. Ambulatory. No weakness. Without contributing medical history.     ROS per HPI.      History reviewed. No pertinent past medical history.  There are no active problems to display for this patient.   History reviewed. No pertinent surgical history.     Home Medications    Prior to Admission medications   Medication Sig Start Date End Date Taking? Authorizing Provider  cyclobenzaprine (FLEXERIL) 5 MG tablet Take 1 tablet (5 mg total) by mouth at bedtime. 02/02/18   Georgetta HaberBurky, Kamaljit Hizer B, NP  naproxen (NAPROSYN) 500 MG tablet Take 1 tablet (500 mg total) by mouth 2 (two) times daily. 02/08/18   Georgetta HaberBurky, Kalab Camps B, NP  predniSONE (DELTASONE) 20 MG tablet Take 2 tablets (40 mg total) by mouth daily with breakfast for 5 days. 02/02/18 02/07/18  Georgetta HaberBurky, Liz Pinho B, NP    Family History No family history on file.  Social History Social History   Tobacco Use  . Smoking status: Current Every Day Smoker  . Smokeless tobacco: Never Used  Substance Use Topics  . Alcohol use: Yes  . Drug use: Yes    Types: Marijuana     Allergies   Patient has no known allergies.   Review of Systems Review of Systems   Physical Exam Triage Vital Signs ED Triage Vitals [02/02/18 1114]  Enc Vitals Group     BP 138/83     Pulse Rate 82     Resp 16     Temp 98.2 F (36.8 C)     Temp src      SpO2 99 %     Weight      Height      Head Circumference      Peak Flow      Pain Score      Pain Loc      Pain Edu?      Excl. in GC?    No data found.  Updated Vital Signs BP 138/83   Pulse 82   Temp 98.2 F (36.8 C)   Resp 16   SpO2 99%   Visual Acuity Right Eye Distance:   Left Eye Distance:   Bilateral Distance:    Right Eye Near:   Left Eye Near:    Bilateral Near:     Physical Exam  Constitutional: He is oriented to person, place, and time. He appears well-developed and well-nourished.  Cardiovascular: Normal rate and regular rhythm.  Pulmonary/Chest: Effort normal and breath sounds normal.  Musculoskeletal:       Lumbar back: He exhibits tenderness, bony tenderness and pain. He exhibits normal range of motion, no swelling, no edema, no deformity, no laceration, no spasm and normal pulse.       Back:  No  step off or deformity; midline low and left back tender on palpation; strength equal to lower extremities; sensation intact; pain with transition from sitting to laying; pain with bilateral hip flexion; min imal pain with straight leg raise; noted very tight hamstrings bilaterally   Neurological: He is alert and oriented to person, place, and time.  Skin: Skin is warm and dry.     UC Treatments / Results  Labs (all labs ordered are listed, but only abnormal results are displayed) Labs Reviewed - No data to display  EKG None  Radiology No results found.  Procedures Procedures (including critical care time)  Medications Ordered in UC Medications - No data to display  Initial Impression / Assessment and Plan / UC Course  I have reviewed the triage vital signs and the nursing notes.  Pertinent labs & imaging results that were available during my care of the patient were reviewed by me and considered in my medical decision making (see chart for details).     Prednisone x5 days to be followed with  naproxen as needed. Limit heavy lifting. Flexeril prn with precautions provided. Encouraged establish with a PCP, follow ups provided. Patient verbalized understanding and agreeable to plan.  Ambulatory out of clinic without difficulty.    Final Clinical Impressions(s) / UC Diagnoses   Final diagnoses:  Acute midline low back pain with left-sided sciatica     Discharge Instructions     Light and regular activity.  Limit heavy lifting to <25 lbs for the next 4 days.  5 days of prednisone.  Flexeril at night for muscle relaxer. May take naproxen twice a day, with food, once naproxen completed.  Follow up with primary care provider and/or sports medicine as needed for follow up. Follow with occupational health if additional work restrictions are needed.    ED Prescriptions    Medication Sig Dispense Auth. Provider   predniSONE (DELTASONE) 20 MG tablet Take 2 tablets (40 mg total) by mouth daily with breakfast for 5 days. 10 tablet Linus Mako B, NP   cyclobenzaprine (FLEXERIL) 5 MG tablet Take 1 tablet (5 mg total) by mouth at bedtime. 15 tablet Linus Mako B, NP   naproxen (NAPROSYN) 500 MG tablet Take 1 tablet (500 mg total) by mouth 2 (two) times daily. 30 tablet Georgetta Haber, NP     Controlled Substance Prescriptions [place] Controlled Substance Registry consulted? Not Applicable   Georgetta Haber, NP 02/02/18 1214

## 2018-02-02 NOTE — Discharge Instructions (Signed)
Light and regular activity.  Limit heavy lifting to <25 lbs for the next 4 days.  5 days of prednisone.  Flexeril at night for muscle relaxer. May take naproxen twice a day, with food, once naproxen completed.  Follow up with primary care provider and/or sports medicine as needed for follow up. Follow with occupational health if additional work restrictions are needed.

## 2019-01-06 ENCOUNTER — Encounter (HOSPITAL_COMMUNITY): Payer: Self-pay | Admitting: Family Medicine

## 2019-01-06 ENCOUNTER — Ambulatory Visit (HOSPITAL_COMMUNITY)
Admission: EM | Admit: 2019-01-06 | Discharge: 2019-01-06 | Disposition: A | Discharge status: Home / Self Care | Payer: Self-pay | Attending: Family Medicine | Admitting: Family Medicine

## 2019-01-06 ENCOUNTER — Other Ambulatory Visit: Payer: Self-pay

## 2019-01-06 DIAGNOSIS — M791 Myalgia, unspecified site: Secondary | ICD-10-CM | POA: Insufficient documentation

## 2019-01-06 DIAGNOSIS — L03011 Cellulitis of right finger: Secondary | ICD-10-CM | POA: Insufficient documentation

## 2019-01-06 LAB — COMPREHENSIVE METABOLIC PANEL
ALT: 15 U/L (ref 0–44)
AST: 18 U/L (ref 15–41)
Albumin: 4.4 g/dL (ref 3.5–5.0)
Alkaline Phosphatase: 44 U/L (ref 38–126)
Anion gap: 9 (ref 5–15)
BUN: 7 mg/dL (ref 6–20)
CO2: 25 mmol/L (ref 22–32)
Calcium: 9.3 mg/dL (ref 8.9–10.3)
Chloride: 105 mmol/L (ref 98–111)
Creatinine, Ser: 0.86 mg/dL (ref 0.61–1.24)
GFR calc Af Amer: >60 mL/min (ref >60)
GFR calc non Af Amer: >60 mL/min (ref >60)
Glucose, Bld: 121 mg/dL — ABNORMAL HIGH (ref 70–99)
Potassium: 3.6 mmol/L (ref 3.5–5.1)
Sodium: 139 mmol/L (ref 135–145)
Total Bilirubin: 0.3 mg/dL (ref 0.3–1.2)
Total Protein: 7.4 g/dL (ref 6.5–8.1)

## 2019-01-06 LAB — CBC WITH DIFFERENTIAL/PLATELET
Abs Immature Granulocytes: 0.03 10*3/uL (ref 0.00–0.07)
Basophils Absolute: 0 10*3/uL (ref 0.0–0.1)
Basophils Relative: 0 %
Eosinophils Absolute: 0.1 10*3/uL (ref 0.0–0.5)
Eosinophils Relative: 1 %
HCT: 42.1 % (ref 39.0–52.0)
Hemoglobin: 13.5 g/dL (ref 13.0–17.0)
Immature Granulocytes: 0 %
Lymphocytes Relative: 26 %
Lymphs Abs: 2.5 10*3/uL (ref 0.7–4.0)
MCH: 26.1 pg (ref 26.0–34.0)
MCHC: 32.1 g/dL (ref 30.0–36.0)
MCV: 81.3 fL (ref 80.0–100.0)
Monocytes Absolute: 1.1 10*3/uL — ABNORMAL HIGH (ref 0.1–1.0)
Monocytes Relative: 12 %
Neutro Abs: 5.9 10*3/uL (ref 1.7–7.7)
Neutrophils Relative %: 61 %
Platelets: 309 10*3/uL (ref 150–400)
RBC: 5.18 MIL/uL (ref 4.22–5.81)
RDW: 12.9 % (ref 11.5–15.5)
WBC: 9.8 10*3/uL (ref 4.0–10.5)
nRBC: 0 % (ref 0.0–0.2)

## 2019-01-06 LAB — SEDIMENTATION RATE: Sed Rate: 2 mm/hr (ref 0–16)

## 2019-01-06 LAB — CK: Total CK: 484 U/L — ABNORMAL HIGH (ref 49–397)

## 2019-01-06 LAB — TSH: TSH: 1.736 u[IU]/mL (ref 0.350–4.500)

## 2019-01-06 MED ORDER — DOXYCYCLINE HYCLATE 100 MG PO TABS: 100.0000 mg | ORAL_TABLET | Freq: Two times a day (BID) | ORAL | 0 refills | Status: DC

## 2019-01-06 NOTE — ED Triage Notes (Signed)
Pt with swelling and pain around right thumb nail

## 2019-01-06 NOTE — ED Provider Notes (Signed)
[facility]    CSN: [id number] Arrival date & time: 01/06/19  1858     History   Chief Complaint Chief Complaint  Patient presents with  . Hand Pain    HPI Edward Blackburn is a 38 y.o. male.   [name] who presents with a swollen thumb and fatigue.  He is an established [facility] patient.  Patient complains of 2 days of progressive swelling on the radial side of his right thumb nail.  No medications taken.  Patient works nights.  Patient also complains of burning weakness in his upper anterior thighs when he tries to run several steps.  This been going on for over a month.  He is a former Engineer, civil (consulting)basketball athlete.  Patient's had no fever.     History reviewed. No pertinent past medical history.  There are no active problems to display for this patient.   History reviewed. No pertinent surgical history.     Home Medications    Prior to Admission medications   Medication Sig Start Date End Date Taking? Authorizing Provider  doxycycline (VIBRA-TABS) 100 MG tablet Take 1 tablet (100 mg total) by mouth 2 (two) times daily. 01/06/19   Elvina SidleLauenstein, Maritza Goldsborough, MD    Family History History reviewed. No pertinent family history.  Social History Social History   Tobacco Use  . Smoking status: Current Every Day Smoker  . Smokeless tobacco: Never Used  Substance Use Topics  . Alcohol use: Yes  . Drug use: Yes    Types: Marijuana     Allergies   Patient has no known allergies.   Review of Systems Review of Systems  Constitutional: Positive for fatigue. Negative for fever.  Musculoskeletal: Positive for gait problem and joint swelling.  Neurological: Positive for weakness.  All other systems reviewed and are negative.    Physical Exam Triage Vital Signs ED Triage Vitals  Enc Vitals Group     BP      Pulse      Resp      Temp      Temp src      SpO2      Weight      Height      Head Circumference      Peak Flow    Pain Score      Pain Loc      Pain Edu?      Excl. in GC?    No data found.  Updated Vital Signs BP (!) 132/91 (BP Location: Left Arm)   Pulse 87   Temp 98.4 F (36.9 C) (Oral)   Resp 18   SpO2 97%    Physical Exam Vitals signs and nursing note reviewed.  Constitutional:      Appearance: Normal appearance.  HENT:     Mouth/Throat:     Mouth: Mucous membranes are moist.  Eyes:     Conjunctiva/sclera: Conjunctivae normal.  Neck:     Musculoskeletal: Normal range of motion and neck supple.  Pulmonary:     Effort: Pulmonary effort is normal.  Skin:    General: Skin is warm and dry.     Comments: Paronychia radial side of right thumb  Neurological:     General: No focal deficit present.     Mental Status: He is alert.  Psychiatric:        Mood and Affect: Mood normal.      UC Treatments / Results  Labs (all labs ordered are  listed, but only abnormal results are displayed) Labs Reviewed  CBC WITH DIFFERENTIAL/PLATELET  COMPREHENSIVE METABOLIC PANEL  CK  SEDIMENTATION RATE  TSH    EKG None  Radiology No results found.  Procedures Incision and Drainage  Date/Time: 01/06/2019 7:50 PM Performed by: Robyn Haber, MD Authorized by: Robyn Haber, MD   Consent:    Consent obtained:  Verbal   Consent given by:  Patient   Risks discussed:  Incomplete drainage and infection   Alternatives discussed:  No treatment Location:    Type:  Abscess   Size:  .5 cm   Location:  Upper extremity   Upper extremity location:  Finger   Finger location:  R thumb Pre-procedure details:    Skin preparation:  Antiseptic wash Anesthesia (see MAR for exact dosages):    Anesthesia method:  None Procedure type:    Complexity:  Simple Procedure details:    Needle aspiration: no     Incision types:  Stab incision   Incision depth:  Subcutaneous   Scalpel blade:  11   Drainage:  Purulent   Drainage amount:  Moderate   Wound treatment:  Wound left open   Packing  materials:  None   (including critical care time)  Medications Ordered in UC Medications - No data to display  Initial Impression / Assessment and Plan / UC Course  I have reviewed the triage vital signs and the nursing notes.  Pertinent labs & imaging results that were available during my care of the patient were reviewed by me and considered in my medical decision making (see chart for details).  The anterior thigh pain which is bilateral is definitely concerning.  Patient appears to be fairly fit and no weakness and pain need to be investigated further.  Patient agrees to follow-up with Lav[facility] Anchors.   Final Clinical Impressions(s) / UC Diagnoses   Final diagnoses:  Paronychia of right thumb  Myalgia   Discharge Instructions   None    ED Prescriptions    Medication Sig Dispense Auth. Provider   doxycycline (VIBRA-TABS) 100 MG tablet Take 1 tablet (100 mg total) by mouth 2 (two) times daily. 20 tablet Robyn Haber, MD     Controlled Substance Prescriptions [place] Controlled Substance Registry consulted? Not Applicable   Robyn Haber, MD 01/06/19 [id number]

## 2019-01-08 ENCOUNTER — Telehealth (HOSPITAL_COMMUNITY): Payer: Self-pay | Admitting: Emergency Medicine

## 2019-01-08 NOTE — Telephone Encounter (Signed)
Reviewed labs with Dr. Lanny Cramp, CK mildly elevated. wil instruct pt to drink more fluids. Attempted to reach patient. No answer at this time. Voicemail left.

## 2019-01-11 ENCOUNTER — Telehealth (HOSPITAL_COMMUNITY): Payer: Self-pay | Admitting: Emergency Medicine

## 2019-01-11 NOTE — Telephone Encounter (Signed)
Attempted to reach patient x2. No answer at this time. Voicemail left.    

## 2019-01-14 ENCOUNTER — Telehealth (HOSPITAL_COMMUNITY): Payer: Self-pay | Admitting: Emergency Medicine

## 2019-01-14 NOTE — Telephone Encounter (Signed)
Patient contacted and made aware of all results, all questions answered.   

## 2019-01-14 NOTE — Telephone Encounter (Signed)
Attempted to reach patient x3. No answer at this time. Voicemail left.   

## 2019-02-11 ENCOUNTER — Other Ambulatory Visit: Payer: Self-pay | Admitting: General Practice

## 2019-02-11 DIAGNOSIS — Z20822 Contact with and (suspected) exposure to covid-19: Secondary | ICD-10-CM

## 2019-02-14 LAB — NOVEL CORONAVIRUS, NAA: SARS-CoV-2, NAA: NOT DETECTED

## 2019-08-24 ENCOUNTER — Telehealth (INDEPENDENT_AMBULATORY_CARE_PROVIDER_SITE_OTHER): Payer: Self-pay | Admitting: Family Medicine

## 2019-08-24 ENCOUNTER — Encounter: Payer: Self-pay | Admitting: Family Medicine

## 2019-08-24 VITALS — Ht 72.0 in

## 2019-08-24 DIAGNOSIS — R519 Headache, unspecified: Secondary | ICD-10-CM

## 2019-08-24 DIAGNOSIS — R200 Anesthesia of skin: Secondary | ICD-10-CM

## 2019-08-24 DIAGNOSIS — G8929 Other chronic pain: Secondary | ICD-10-CM | POA: Insufficient documentation

## 2019-08-24 DIAGNOSIS — M545 Low back pain, unspecified: Secondary | ICD-10-CM | POA: Insufficient documentation

## 2019-08-24 DIAGNOSIS — R202 Paresthesia of skin: Secondary | ICD-10-CM

## 2019-08-24 DIAGNOSIS — R748 Abnormal levels of other serum enzymes: Secondary | ICD-10-CM

## 2019-08-24 MED ORDER — TOPIRAMATE 50 MG PO TABS: 50.0000 mg | ORAL_TABLET | Freq: Every day | ORAL | 1 refills | Status: DC

## 2019-08-24 NOTE — Patient Instructions (Signed)
[facility] - [phone] -- New patient/Migraines//tes/npp

## 2019-08-24 NOTE — Progress Notes (Signed)
Virtual Visit via Video Note   I connected with [name] on 08/24/19 by a video enabled telemedicine application and verified that I am speaking with the correct person using two identifiers.  Location patient: home Location provider:work office Persons participating in the virtual visit: patient, provider  I discussed the limitations of evaluation and management by telemedicine and the availability of in person appointments. The patient expressed understanding and agreed to proceed.   HPI: Mr. Edward Blackburn is a 39 year old male who is establishing care today. Former PCP: N/A Last CPE: > 1 year ago.  Today he is complaining about worsening headache. He reporting "migraines", started having headaches years ago, it has been infrequent but for the past 3 to 4 weeks he is having headaches a few times per week, every 2 days. It seems to be exacerbated by stress. He has tried Advil but it does not seem to help.  Bitemporal and frontal throbbing/pressure headache, intermittent. Most of the time 6-7/10, occasionally 10/10 causing eye "tears."  Negative for conjunctival injection. Headache usually starts in the middle of the day. Associated nausea sometimes. + Photophobia. He does not have preceding symptoms or visual aura.  Negative for visual changes, vomiting, or focal neurologic deficit. Negative for nasal congestion, rhinorrhea, sore throat, or postnasal drainage.  Family history negative for migraines.  He is reporting about 3 years of lower extremity numbness and tingling, from thighs to knees and occasionally to calves.  Problem seems to be exacerbated by prolonged running and by certain movements when stretching in the morning (associated with lower back pain). Intermittent lower back pain, radiated to lower extremities and occasional "pop" sensation. Negative for saddle anesthesia or changes in bowel/bladder function.  He has been prescribed prednisone, which has helped  temporarily. He has not had imaging done. He was in the ER in 12/2018.  He denies CP, dyspnea, palpitation, or edema.  Lab Results  Component Value Date   WBC 9.8 01/06/2019   HGB 13.5 01/06/2019   HCT 42.1 01/06/2019   MCV 81.3 01/06/2019   PLT 309 01/06/2019   Lab Results  Component Value Date   CKTOTAL 484 (H) 01/06/2019   Lab Results  Component Value Date   ESRSEDRATE 2 01/06/2019   Lab Results  Component Value Date   CREATININE 0.86 01/06/2019   BUN 7 01/06/2019   NA 139 01/06/2019   K 3.6 01/06/2019   CL 105 01/06/2019   CO2 25 01/06/2019   Lab Results  Component Value Date   TSH 1.736 01/06/2019   + Tobacco use, cigarettes.  FHx positive for DM (great-grandmother), parents hypertension, father CAD and brain aneurysm.  ROS: See pertinent positives and negatives per HPI.  No past medical history on file.  No past surgical history on file.  No family history on file.  Social History   Socioeconomic History  . Marital status: Married    Spouse name: Not on file  . Number of children: Not on file  . Years of education: Not on file  . Highest education level: Not on file  Occupational History  . Not on file  Tobacco Use  . Smoking status: Current Every Day Smoker  . Smokeless tobacco: Never Used  Substance and Sexual Activity  . Alcohol use: Yes  . Drug use: Yes    Types: Marijuana  . Sexual activity: Not on file  Other Topics Concern  . Not on file  Social History Narrative  . Not on file   Social Determinants of  Health   Financial Resource Strain:   . Difficulty of Paying Living Expenses: Not on file  Food Insecurity:   . Worried About Programme researcher, broadcasting/film/video in the Last Year: Not on file  . Ran Out of Food in the Last Year: Not on file  Transportation Needs:   . Lack of Transportation (Medical): Not on file  . Lack of Transportation (Non-Medical): Not on file  Physical Activity:   . Days of Exercise per Week: Not on file  . Minutes of  Exercise per Session: Not on file  Stress:   . Feeling of Stress : Not on file  Social Connections:   . Frequency of Communication with Friends and Family: Not on file  . Frequency of Social Gatherings with Friends and Family: Not on file  . Attends Religious Services: Not on file  . Active Member of Clubs or Organizations: Not on file  . Attends Banker Meetings: Not on file  . Marital Status: Not on file  Intimate Partner Violence:   . Fear of Current or Ex-Partner: Not on file  . Emotionally Abused: Not on file  . Physically Abused: Not on file  . Sexually Abused: Not on file     Current Outpatient Medications:  .  topiramate (TOPAMAX) 50 MG tablet, Take 1 tablet (50 mg total) by mouth at bedtime., Disp: 30 tablet, Rfl: 1  EXAM:  VITALS per patient if applicable:Ht 6' (1.829 m)   BMI 27.57 kg/m   GENERAL: alert, oriented, appears well and in no acute distress  HEENT: atraumatic, conjunctiva clear, no obvious abnormalities on inspection.  NECK: normal movements of the head and neck  LUNGS: on inspection no signs of respiratory distress, breathing rate appears normal, no obvious gross SOB, gasping or wheezing  CV: no obvious cyanosis  MS: moves all visible extremities without noticeable abnormality  PSYCH/NEURO: pleasant and cooperative, no obvious depression or anxiety, speech and thought processing grossly intact  ASSESSMENT AND PLAN:  Discussed the following assessment and plan:  Headache, unspecified headache type Possible etiologies of headache discussed. History suggest migraine headache without aura. We discussed treatment options, he agrees with trying Topamax 50 mg, recommend starting with 1/2 tablet and increase to the whole tablet after 5 days. We discussed some side effects. I do not think head imaging is needed at this time. Instructed about warning signs. Recommend starting a headache diary. Follow-up in 4 to 6 weeks.  Chronic low  back pain, unspecified back pain laterality, unspecified whether sciatica present Currently he is asymptomatic. This problem could be related with lower extremity numbness tingling.  Numbness and tingling of both lower extremities ?  Radiculopathy. We will arrange lumbar MRI.  Elevated CK We will plan on checking CK next visit.  I discussed the assessment and treatment plan with the patient. [name] was provided an opportunity to ask questions and all were answered. He agreed with the plan and demonstrated an understanding of the instructions.    Return in about 6 weeks (around 10/05/2019) for HA + Labs.    Kerin Kren [place], MD

## 2019-08-27 NOTE — Progress Notes (Signed)
Patient is scheduled for 03/16 at 9:30

## 2019-09-18 ENCOUNTER — Encounter (HOSPITAL_COMMUNITY): Payer: Self-pay

## 2019-09-18 ENCOUNTER — Ambulatory Visit (HOSPITAL_COMMUNITY)
Admission: EM | Admit: 2019-09-18 | Discharge: 2019-09-18 | Disposition: A | Discharge status: Home / Self Care | Payer: BC Managed Care – PPO | Attending: Family Medicine | Admitting: Family Medicine

## 2019-09-18 ENCOUNTER — Other Ambulatory Visit: Payer: Self-pay

## 2019-09-18 DIAGNOSIS — R369 Urethral discharge, unspecified: Secondary | ICD-10-CM | POA: Diagnosis not present

## 2019-09-18 LAB — HIV ANTIBODY (ROUTINE TESTING W REFLEX): HIV Screen 4th Generation wRfx: NONREACTIVE

## 2019-09-18 MED ORDER — CEFTRIAXONE SODIUM 500 MG IJ SOLR
500.0000 mg | Freq: Once | INTRAMUSCULAR | Status: AC
  Administered 2019-09-18: 13:00:00 500 mg via INTRAMUSCULAR

## 2019-09-18 MED ORDER — LIDOCAINE HCL (PF) 1 % IJ SOLN
INTRAMUSCULAR | Status: AC
  Filled 2019-09-18: qty 2

## 2019-09-18 MED ORDER — CEFTRIAXONE SODIUM 500 MG IJ SOLR
INTRAMUSCULAR | Status: AC
  Filled 2019-09-18: qty 500

## 2019-09-18 MED ORDER — DOXYCYCLINE HYCLATE 100 MG PO CAPS: 100.0000 mg | ORAL_CAPSULE | Freq: Two times a day (BID) | ORAL | 0 refills | Status: DC

## 2019-09-18 NOTE — ED Triage Notes (Signed)
Pt present STD screening, pt states that he just do not feel right. Pt denies having any symptoms at this time.

## 2019-09-18 NOTE — Discharge Instructions (Signed)

## 2019-09-18 NOTE — ED Provider Notes (Signed)
[facility]   [id number] 09/18/19 Arrival Time: 5427  ASSESSMENT & PLAN:  1. Penile discharge       Discharge Instructions     You have been given the following today for treatment of suspected gonorrhea and/or chlamydia:  cefTRIAXone (ROCEPHIN) injection 500 mg  Please pick up your prescription for doxycycline 100 mg and begin taking twice daily for the next seven (7) days.  Even though we have treated you today, we have sent testing for sexually transmitted infections. We will notify you of any positive results once they are received. If required, we will prescribe any medications you might need.  Please refrain from all sexual activity for at least the next seven days.     Pending: Labs Reviewed  RPR  HIV ANTIBODY (ROUTINE TESTING W REFLEX)  CYTOLOGY, (ORAL, ANAL, URETHRAL) ANCILLARY ONLY    Will notify of any positive results. Instructed to refrain from sexual activity for at least seven days.  Reviewed expectations re: course of current medical issues. Questions answered. Outlined signs and symptoms indicating need for more acute intervention. Patient verbalized understanding. After Visit Summary given.   SUBJECTIVE:  Edward Chelf Sr. is a 39 y.o. male who presents with complaint of penile discharge. Onset gradual. First noticed 1-2 d ago. Describes discharge as thick and opaque. No specific aggravating or alleviating factors reported. Denies: urinary frequency, dysuria and gross hematuria. Afebrile. No abdominal or pelvic pain. No n/v. No rashes or lesions. Reports that he is sexually active with single male partner. OTC treatment: none. History of STI: h/o treated gonorrhea/chlamydia in the past   OBJECTIVE:  Vitals:   09/18/19 1252  BP: 132/85  Pulse: 71  Resp: 18  SpO2: 100%     General appearance: alert, cooperative, appears stated age and no distress Throat: lips, mucosa, and tongue normal; teeth and gums normal Lungs: unlabored  respirations Back: no CVA tenderness; FROM at waist Abdomen: soft, non-tender GU: normal appearing genitalia Skin: warm and dry Psychological: alert and cooperative; normal mood and affect.    Labs Reviewed  RPR  HIV ANTIBODY (ROUTINE TESTING W REFLEX)  CYTOLOGY, (ORAL, ANAL, URETHRAL) ANCILLARY ONLY    No Known Allergies   History reviewed. No pertinent family history.   Social History   Socioeconomic History  . Marital status: Married    Spouse name: Not on file  . Number of children: Not on file  . Years of education: Not on file  . Highest education level: Not on file  Occupational History  . Not on file  Tobacco Use  . Smoking status: Current Every Day Smoker  . Smokeless tobacco: Never Used  Substance and Sexual Activity  . Alcohol use: Yes  . Drug use: Yes    Types: Marijuana  . Sexual activity: Not on file  Other Topics Concern  . Not on file  Social History Narrative  . Not on file   Social Determinants of Health   Financial Resource Strain:   . Difficulty of Paying Living Expenses: Not on file  Food Insecurity:   . Worried About Charity fundraiser in the Last Year: Not on file  . Ran Out of Food in the Last Year: Not on file  Transportation Needs:   . Lack of Transportation (Medical): Not on file  . Lack of Transportation (Non-Medical): Not on file  Physical Activity:   . Days of Exercise per Week: Not on file  . Minutes of Exercise per Session: Not on file  Stress:   . Feeling of Stress : Not on file  Social Connections:   . Frequency of Communication with Friends and Family: Not on file  . Frequency of Social Gatherings with Friends and Family: Not on file  . Attends Religious Services: Not on file  . Active Member of Clubs or Organizations: Not on file  . Attends Banker Meetings: Not on file  . Marital Status: Not on file  Intimate Partner Violence:   . Fear of Current or Ex-Partner: Not on file  . Emotionally Abused: Not  on file  . Physically Abused: Not on file  . Sexually Abused: Not on file          Mardella Layman, MD 09/18/19 1304

## 2019-09-19 LAB — RPR: RPR Ser Ql: NONREACTIVE

## 2019-09-21 ENCOUNTER — Telehealth (HOSPITAL_COMMUNITY): Payer: Self-pay | Admitting: Emergency Medicine

## 2019-09-21 ENCOUNTER — Encounter (HOSPITAL_COMMUNITY): Payer: Self-pay

## 2019-09-21 LAB — CYTOLOGY, (ORAL, ANAL, URETHRAL) ANCILLARY ONLY
Chlamydia: NEGATIVE
Neisseria Gonorrhea: POSITIVE — AB
Trichomonas: POSITIVE — AB

## 2019-09-21 MED ORDER — METRONIDAZOLE 500 MG PO TABS: 2000.0000 mg | ORAL_TABLET | Freq: Once | ORAL | 0 refills | Status: AC

## 2019-09-21 NOTE — Telephone Encounter (Signed)
Trichomonas is positive. Rx  for Flagyl 2 grams, once was sent to the pharmacy of record. Pt needs education to refrain from sexual intercourse for 7 days to give the medicine time to work. Sexual partners need to be notified and tested/treated. Condoms may reduce risk of reinfection. Recheck for further evaluation if symptoms are not improving.   Test for gonorrhea was positive. This was treated at the urgent care visit with IM rocephin 500mg . Please refrain from sexual intercourse for 7 days after treatment to give the medicine time to work. Sexual partners need to be notified and tested/treated. Condoms may reduce risk of reinfection. Recheck or followup with PCP for further evaluation if symptoms are not improving. [facility] notified.   Attempted to reach patient. No answer at this time. Voicemail left.   If you have any questions, you may call me at [phone]

## 2019-09-23 ENCOUNTER — Telehealth (HOSPITAL_COMMUNITY): Payer: Self-pay | Admitting: Emergency Medicine

## 2019-09-23 NOTE — Telephone Encounter (Signed)
Patient contacted by phone and made aware of cytology   results and meds at pharmacy. Pt verbalized understanding and had all questions answered.

## 2019-10-04 ENCOUNTER — Other Ambulatory Visit: Payer: Self-pay

## 2019-10-05 ENCOUNTER — Encounter: Payer: Self-pay | Admitting: Family Medicine

## 2019-10-05 ENCOUNTER — Ambulatory Visit (INDEPENDENT_AMBULATORY_CARE_PROVIDER_SITE_OTHER): Payer: BC Managed Care – PPO | Admitting: Family Medicine

## 2019-10-05 VITALS — BP 128/80 | HR 76 | Temp 96.4°F | Resp 12 | Ht 72.0 in | Wt 185.0 lb

## 2019-10-05 DIAGNOSIS — Z130 Encounter for screening for diseases of the blood and blood-forming organs and certain disorders involving the immune mechanism: Secondary | ICD-10-CM | POA: Diagnosis not present

## 2019-10-05 DIAGNOSIS — Z0000 Encounter for general adult medical examination without abnormal findings: Secondary | ICD-10-CM | POA: Diagnosis not present

## 2019-10-05 DIAGNOSIS — Z13228 Encounter for screening for other metabolic disorders: Secondary | ICD-10-CM

## 2019-10-05 DIAGNOSIS — Z23 Encounter for immunization: Secondary | ICD-10-CM | POA: Diagnosis not present

## 2019-10-05 DIAGNOSIS — L309 Dermatitis, unspecified: Secondary | ICD-10-CM

## 2019-10-05 DIAGNOSIS — Z13220 Encounter for screening for lipoid disorders: Secondary | ICD-10-CM | POA: Diagnosis not present

## 2019-10-05 DIAGNOSIS — Z1329 Encounter for screening for other suspected endocrine disorder: Secondary | ICD-10-CM

## 2019-10-05 LAB — BASIC METABOLIC PANEL
BUN: 7 mg/dL (ref 6–23)
CO2: 30 mEq/L (ref 19–32)
Calcium: 9.3 mg/dL (ref 8.4–10.5)
Chloride: 103 mEq/L (ref 96–112)
Creatinine, Ser: 0.76 mg/dL (ref 0.40–1.50)
GFR: 137.54 mL/min (ref >60.00)
Glucose, Bld: 96 mg/dL (ref 70–99)
Potassium: 4.1 mEq/L (ref 3.5–5.1)
Sodium: 139 mEq/L (ref 135–145)

## 2019-10-05 LAB — LIPID PANEL
Cholesterol: 139 mg/dL (ref 0–200)
HDL: 47.8 mg/dL (ref >39.00)
LDL Cholesterol: 77 mg/dL (ref 0–99)
NonHDL: 90.99
Total CHOL/HDL Ratio: 3
Triglycerides: 71 mg/dL (ref 0.0–149.0)
VLDL: 14.2 mg/dL (ref 0.0–40.0)

## 2019-10-05 LAB — HEMOGLOBIN A1C: Hgb A1c MFr Bld: 5.9 % (ref 4.6–6.5)

## 2019-10-05 MED ORDER — CLOTRIMAZOLE-BETAMETHASONE 1-0.05 % EX CREA: 1.0000 "application " | TOPICAL_CREAM | Freq: Two times a day (BID) | CUTANEOUS | 1 refills | Status: DC | PRN

## 2019-10-05 NOTE — Patient Instructions (Signed)
A few things to remember from today's visit:    At least 150 minutes of moderate exercise per week, daily brisk walking for 15-30 min is a good exercise option. Healthy diet low in saturated (animal) fats and sweets and consisting of fresh fruits and vegetables, lean meats such as fish and white chicken and whole grains.  - Vaccines:  Tdap vaccine every 10 years.  Shingles vaccine recommended at age 40, could be given after 40 years of age but not sure about insurance coverage.  Pneumonia vaccines: Pneumovax at 65.   -Screening recommendations for low/normal risk males:  Screening for diabetes at age 8 and every 3 years. Earlier screening if cardiovascular risk factors.   Lipid screening at 35 and every 3 years. Screening starts in younger males with cardiovascular risk factors.  Colon cancer screening at age 21 and until age 51.  Prostate cancer screening: some controversy, starts usually at 50: Rectal exam and PSA.  Aortic Abdominal Aneurism once between 35 and 67 years old if ever smoker.  Also recommended:  1. Dental visit- Brush and floss your teeth twice daily; visit your dentist twice a year. 2. Eye doctor- Get an eye exam at least every 2 years. 3. Helmet use- Always wear a helmet when riding a bicycle, motorcycle, rollerblading or skateboarding. 4. Safe sex- If you may be exposed to sexually transmitted infections, use a condom. 5. Seat belts- Seat belts can save your live; always wear one. 6. Smoke/Carbon Monoxide detectors- These detectors need to be installed on the appropriate level of your home. Replace batteries at least once a year. 7. Skin cancer- When out in the sun please cover up and use sunscreen 15 SPF or higher. 8. Violence- If anyone is threatening or hurting you, please tell your healthcare provider.  9. Drink alcohol in moderation- Limit alcohol intake to one drink or less per day. Never drink and drive.  Please be sure medication list is  accurate. If a new problem present, please set up appointment sooner than planned today.   Continue working on smoking cessation. Cream for feet sent to your pharmacy. I will see you back in 6 months. If headache comes back we need to resume Topamax.

## 2019-10-05 NOTE — Progress Notes (Signed)
HPI:  [name] is a 39 y.o.male here today for his routine physical examination.  Last CPE: Over 1-2 years ago. He lives with his wife and 4 of his children.  Regular exercise 3 or more times per week: 3 times per week he is going to the gym. Following a healthy diet: Not consistently.  Chronic medical problems: Back pain,headache, and LE numbness. He was last seen on 08/24/19, when he was c/o worsening headache. Topamax was recommended, he took med for 3-4 weeks and headaches resolved. So he stopped medication.  Immunization History  Administered Date(s) Administered  . Tdap 10/05/2019   -Denies high alcohol intake or Hx of illicit drug use. Smoker since age 41, 1/2 PPD.  -Concerns and/or follow up today: Pruritic rash on toes and plantar aspect of feet. He has had problem for a while.Recently treated with oral treatment for fungal infection but did not help. Intermittent. Not sure about exacerbating or alleviating factors.  Review of Systems  Constitutional: Negative for activity change, appetite change, fatigue and fever.  HENT: Negative for dental problem, mouth sores, nosebleeds and sore throat.   Eyes: Negative for redness and visual disturbance.  Respiratory: Negative for cough, shortness of breath and wheezing.   Cardiovascular: Negative for chest pain, palpitations and leg swelling.  Gastrointestinal: Negative for abdominal pain, blood in stool, nausea and vomiting.  Endocrine: Negative for cold intolerance, heat intolerance, polydipsia, polyphagia and polyuria.  Genitourinary: Negative for decreased urine volume, dysuria, genital sores, hematuria and testicular pain.  Musculoskeletal: Negative for back pain and myalgias.  Skin: Negative for color change and wound.  Allergic/Immunologic: Positive for environmental allergies.  Neurological: Negative for syncope, facial asymmetry and weakness.  Hematological: Negative for adenopathy. Does not  bruise/bleed easily.  Psychiatric/Behavioral: Negative for confusion and sleep disturbance. The patient is not nervous/anxious.   All other systems reviewed and are negative.  No current outpatient medications on file prior to visit.   No current facility-administered medications on file prior to visit.   History reviewed. No pertinent past medical history.  History reviewed. No pertinent surgical history.  No Known Allergies  History reviewed. No pertinent family history.  Social History   Socioeconomic History  . Marital status: Married    Spouse name: Not on file  . Number of children: Not on file  . Years of education: Not on file  . Highest education level: Not on file  Occupational History  . Not on file  Tobacco Use  . Smoking status: Current Every Day Smoker  . Smokeless tobacco: Never Used  Substance and Sexual Activity  . Alcohol use: Yes  . Drug use: Yes    Types: Marijuana  . Sexual activity: Not on file  Other Topics Concern  . Not on file  Social History Narrative  . Not on file   Social Determinants of Health   Financial Resource Strain:   . Difficulty of Paying Living Expenses:   Food Insecurity:   . Worried About Programme researcher, broadcasting/film/video in the Last Year:   . Barista in the Last Year:   Transportation Needs:   . Freight forwarder (Medical):   Marland Kitchen Lack of Transportation (Non-Medical):   Physical Activity:   . Days of Exercise per Week:   . Minutes of Exercise per Session:   Stress:   . Feeling of Stress :   Social Connections:   . Frequency of Communication with Friends and Family:   .  Frequency of Social Gatherings with Friends and Family:   . Attends Religious Services:   . Active Member of Clubs or Organizations:   . Attends Banker Meetings:   Marland Kitchen Marital Status:     Vitals:   10/05/19 0923  BP: 128/80  Pulse: 76  Resp: 12  Temp: (!) 96.4 F (35.8 C)  SpO2: 98%   Body mass index is 25.09 kg/m.  Wt Readings  from Last 3 Encounters:  10/05/19 185 lb (83.9 kg)  07/23/15 203 lb 4.8 oz (92.2 kg)    Physical Exam  Nursing note and vitals reviewed. Constitutional: He is oriented to person, place, and time. He appears well-developed and well-nourished. No distress.  HENT:  Head: Normocephalic and atraumatic.  Right Ear: Tympanic membrane, external ear and ear canal normal.  Left Ear: Tympanic membrane, external ear and ear canal normal.  Mouth/Throat: Oropharynx is clear and moist and mucous membranes are normal.  Eyes: Pupils are equal, round, and reactive to light. Conjunctivae and EOM are normal.  Neck: No tracheal deviation present. No thyromegaly present.  Cardiovascular: Normal rate and regular rhythm.  No murmur heard. Pulses:      Dorsalis pedis pulses are 2+ on the right side and 2+ on the left side.  Respiratory: Effort normal and breath sounds normal. No respiratory distress.  GI: Soft. He exhibits no mass. There is no hepatomegaly. There is no abdominal tenderness.  Genitourinary:    Genitourinary Comments: Refused,no concerns.   Musculoskeletal:        General: No tenderness or edema.     Cervical back: Normal range of motion.     Comments: No major deformities appreciated and no signs of synovitis.  Lymphadenopathy:    He has no cervical adenopathy.       Right: No supraclavicular adenopathy present.       Left: No supraclavicular adenopathy present.  Neurological: He is alert and oriented to person, place, and time. He has normal strength. No cranial nerve deficit or sensory deficit. Coordination and gait normal.  Reflex Scores:      Bicep reflexes are 2+ on the right side and 2+ on the left side.      Patellar reflexes are 2+ on the right side and 2+ on the left side. Skin: Skin is warm. No erythema.  Psychiatric: He has a normal mood and affect. Cognition and memory are normal.  Well groomed,good eye contact.   ASSESSMENT AND PLAN:  [name] was seen today for  follow-up and annual exam.  Diagnoses and all orders for this visit:  Orders Placed This Encounter  Procedures  . Tdap vaccine greater than or equal to 7yo IM  . Basic metabolic panel  . Lipid panel  . Hemoglobin A1c   Lab Results  Component Value Date   HGBA1C 5.9 10/05/2019   Lab Results  Component Value Date   CHOL 139 10/05/2019   HDL 47.80 10/05/2019   LDLCALC 77 10/05/2019   TRIG 71.0 10/05/2019   CHOLHDL 3 10/05/2019   Lab Results  Component Value Date   CREATININE 0.76 10/05/2019   BUN 7 10/05/2019   NA 139 10/05/2019   K 4.1 10/05/2019   CL 103 10/05/2019   CO2 30 10/05/2019    Routine general medical examination at a health care facility We discussed the importance of regular physical activity and healthy diet for prevention of chronic illness and/or complications. Preventive guidelines reviewed. Vaccination updated.  Next CPE in a year.  Screening  for lipoid disorders -     Lipid panel  Screening for endocrine, metabolic and immunity disorder -     Basic metabolic panel -     Hemoglobin A1c  Chronic eczema of foot Educated about dx,prognosis,and treatment options. Lotrisone cream bid as needed. He will let me know if he wants derma referral at some point.  -     clotrimazole-betamethasone (LOTRISONE) cream; Apply 1 application topically 2 (two) times daily as needed.  Need for Tdap vaccination -     Tdap vaccine greater than or equal to 7yo IM    Return in 1 year (on 10/04/2020), or F/U sooner if headache, for CPE. Marland Kitchen    Lexington Krotz G. [place], MD  [facility]. [place] office.

## 2019-10-09 ENCOUNTER — Encounter: Payer: Self-pay | Admitting: Family Medicine

## 2020-01-07 ENCOUNTER — Encounter (HOSPITAL_COMMUNITY): Payer: Self-pay

## 2020-01-07 ENCOUNTER — Ambulatory Visit (HOSPITAL_COMMUNITY)
Admission: EM | Admit: 2020-01-07 | Discharge: 2020-01-07 | Disposition: A | Discharge status: Home / Self Care | Payer: BC Managed Care – PPO | Attending: Physician Assistant | Admitting: Physician Assistant

## 2020-01-07 ENCOUNTER — Other Ambulatory Visit: Payer: Self-pay

## 2020-01-07 DIAGNOSIS — B351 Tinea unguium: Secondary | ICD-10-CM | POA: Diagnosis not present

## 2020-01-07 DIAGNOSIS — R369 Urethral discharge, unspecified: Secondary | ICD-10-CM

## 2020-01-07 LAB — HIV ANTIBODY (ROUTINE TESTING W REFLEX): HIV Screen 4th Generation wRfx: NONREACTIVE

## 2020-01-07 MED ORDER — TERBINAFINE HCL 250 MG PO TABS: 250.0000 mg | ORAL_TABLET | Freq: Every day | ORAL | 0 refills | Status: AC

## 2020-01-07 MED ORDER — CEFTRIAXONE SODIUM 500 MG IJ SOLR
INTRAMUSCULAR | Status: AC
Start: 2020-01-07 — End: ?
  Filled 2020-01-07: qty 500

## 2020-01-07 MED ORDER — CEFTRIAXONE SODIUM 500 MG IJ SOLR
500.0000 mg | Freq: Once | INTRAMUSCULAR | Status: AC
  Administered 2020-01-07: 500 mg via INTRAMUSCULAR

## 2020-01-07 MED ORDER — DOXYCYCLINE HYCLATE 100 MG PO CAPS: 100.0000 mg | ORAL_CAPSULE | Freq: Two times a day (BID) | ORAL | 0 refills | Status: AC

## 2020-01-07 NOTE — ED Provider Notes (Signed)
[facility]    CSN: [id number] Arrival date & time: 01/07/20  1300      History   Chief Complaint Chief Complaint  Patient presents with  . Penile Discharge  . Foot Issue    HPI Edward Hinch Sr. is a 39 y.o. male.   Patient presents urgent care for 2 concerns.  The first is for urethral discharge.  There is been present for 2 days.  He reports a fair amount of yellow/green discharge.  He denies painful urination or frequency of urination.  Denies testicular pain or swelling.  He reports he is unsure how this occurred as he is sexually active only with 1 partner.  He reports his partner and him had anal sex 2 days ago.  He reports he penetrated partner.  Reports he has had issues with STDs before.  He would also like blood work.  He also reports for some time he has been having issues with itching and pain between his toes.  He has been treated for fungal infections/athlete's foot before.  He was also treated with oral antifungals in the past.  He does report that his primary care provider that he saw thought this might be something else.  He reports his feet between his toes will itch a lot and then become quite sore.  Reports it seems like he will have wetness or skin breakdown between his toes.  He reports his big toenails and little to be also been thickened for quite some time.  He denies a history of diabetes.       History reviewed. No pertinent past medical history.  Patient Active Problem List   Diagnosis Date Noted  . Numbness and tingling of both lower extremities 08/24/2019  . Chronic low back pain 08/24/2019  . Headache, unspecified headache type 08/24/2019    History reviewed. No pertinent surgical history.     Home Medications    Prior to Admission medications   Medication Sig Start Date End Date Taking? Authorizing Provider  clotrimazole-betamethasone (LOTRISONE) cream Apply 1 application topically 2 (two) times daily as needed. 10/05/19    [place], Betty G, MD  doxycycline (VIBRAMYCIN) 100 MG capsule Take 1 capsule (100 mg total) by mouth 2 (two) times daily for 7 days. 01/07/20 01/14/20  Addis Tuohy, Veryl Speak, PA-C  terbinafine (LAMISIL) 250 MG tablet Take 1 tablet (250 mg total) by mouth daily for 14 days. 01/07/20 01/21/20  Elijah Phommachanh, Veryl Speak, PA-C    Family History Family History  Family history unknown: Yes    Social History Social History   Tobacco Use  . Smoking status: Current Every Day Smoker  . Smokeless tobacco: Never Used  Substance Use Topics  . Alcohol use: Yes  . Drug use: Yes    Types: Marijuana     Allergies   Patient has no known allergies.   Review of Systems Review of Systems   Physical Exam Triage Vital Signs ED Triage Vitals  Enc Vitals Group     BP 01/07/20 1409 136/84     Pulse Rate 01/07/20 1409 63     Resp 01/07/20 1409 18     Temp 01/07/20 1409 99.2 F (37.3 C)     Temp Source 01/07/20 1409 Oral     SpO2 01/07/20 1409 99 %     Weight --      Height --      Head Circumference --      Peak Flow --      Pain  Score 01/07/20 1412 8     Pain Loc --      Pain Edu? --      Excl. in [place]? --    No data found.  Updated Vital Signs BP 136/84 (BP Location: Right Arm)   Pulse 63   Temp 99.2 F (37.3 C) (Oral)   Resp 18   SpO2 99%   Visual Acuity Right Eye Distance:   Left Eye Distance:   Bilateral Distance:    Right Eye Near:   Left Eye Near:    Bilateral Near:     Physical Exam Vitals and nursing note reviewed.  Constitutional:      Appearance: Normal appearance.  Genitourinary:    Comments: No skin lesions.  Testicles normal without mass or tenderness.  There is yellow urethral discharge appreciable on exam, this was sampled and swab sent. Skin:    Comments: There is hyperpigmentation between the webspaces of all toes.  Patient does have powder applied, so difficult to assess for excess skin between toes.  Both great toenails and fifth toenails thickened and brittle.   Remaining toes without thickening or brittle nature.  Neurological:     Mental Status: He is alert.      UC Treatments / Results  Labs (all labs ordered are listed, but only abnormal results are displayed) Labs Reviewed  HIV ANTIBODY (ROUTINE TESTING W REFLEX)  RPR  CYTOLOGY, (ORAL, ANAL, URETHRAL) ANCILLARY ONLY    EKG   Radiology No results found.  Procedures Procedures (including critical care time)  Medications Ordered in UC Medications  cefTRIAXone (ROCEPHIN) injection 500 mg (500 mg Intramuscular Given 01/07/20 1543)    Initial Impression / Assessment and Plan / UC Course  I have reviewed the triage vital signs and the nursing notes.  Pertinent labs & imaging results that were available during my care of the patient were reviewed by me and considered in my medical decision making (see chart for details).     #Urethral discharge #Onychomycosis #Tinea pedis Patient is a 39 year old presenting with urethral discharge as well as onychomycosis with tinea pedis.  We will treat him with Rocephin and outpatient doxycycline.  Patient had positive result for gonorrhea and trichomonas in February 2021, he had treatment completed.  Urethral swab sent, HIV and syphilis also sent.  With regard to his toenail fungal infection, we will start on Lamisil oral.  I prescribed 2 weeks for the patient and he is to follow-up with his primary care for further management and continued therapy.  Discussed that it is important as this medicine can cause issues with liver if is not closely watched.  Patient verbalized understanding and was able to schedule an appointment with his primary care in 2 weeks.  Discussed safe sex precautions.  Patient verbalized understanding of the plan of care. Final Clinical Impressions(s) / UC Diagnoses   Final diagnoses:  Urethral discharge  Onychomycosis     Discharge Instructions     We have sent your labs, we will notify you of results requiring changes  to treatment  Take the doxycycline 2 times a day for 7 days  Take the terbinafine 1 time a day, I have sent 2 weeks in, you need to follow up with your Primary care for continued prescribing as this is a long treatment and to monitor you while on this medication  No sex until all results and all treatments      ED Prescriptions    Medication Sig Dispense Auth. Provider  doxycycline (VIBRAMYCIN) 100 MG capsule Take 1 capsule (100 mg total) by mouth 2 (two) times daily for 7 days. 14 capsule Vicci Reder, Veryl Speak, PA-C   terbinafine (LAMISIL) 250 MG tablet Take 1 tablet (250 mg total) by mouth daily for 14 days. 14 tablet Marri Mcneff, Veryl Speak, PA-C     PDMP not reviewed this encounter.   Hermelinda Medicus, PA-C 01/08/20 0105

## 2020-01-07 NOTE — ED Triage Notes (Signed)
Pt presents with penile discharge; pt also complains of bilateral pain in feet & sore in between toes.

## 2020-01-07 NOTE — Discharge Instructions (Signed)
We have sent your labs, we will notify you of results requiring changes to treatment  Take the doxycycline 2 times a day for 7 days  Take the terbinafine 1 time a day, I have sent 2 weeks in, you need to follow up with your Primary care for continued prescribing as this is a long treatment and to monitor you while on this medication  No sex until all results and all treatments

## 2020-01-08 LAB — RPR: RPR Ser Ql: NONREACTIVE

## 2020-01-10 LAB — CYTOLOGY, (ORAL, ANAL, URETHRAL) ANCILLARY ONLY
Chlamydia: POSITIVE — AB
Comment: NEGATIVE
Comment: NEGATIVE
Comment: NORMAL
Neisseria Gonorrhea: POSITIVE — AB
Trichomonas: NEGATIVE

## 2020-01-19 ENCOUNTER — Encounter: Payer: Self-pay | Admitting: Family Medicine

## 2020-01-19 ENCOUNTER — Ambulatory Visit (INDEPENDENT_AMBULATORY_CARE_PROVIDER_SITE_OTHER): Payer: BC Managed Care – PPO | Admitting: Family Medicine

## 2020-01-19 ENCOUNTER — Ambulatory Visit (INDEPENDENT_AMBULATORY_CARE_PROVIDER_SITE_OTHER)
Admission: RE | Admit: 2020-01-19 | Discharge: 2020-01-19 | Disposition: A | Discharge status: Home / Self Care | Payer: BC Managed Care – PPO | Source: Ambulatory Visit | Attending: Family Medicine | Admitting: Family Medicine

## 2020-01-19 ENCOUNTER — Other Ambulatory Visit: Payer: Self-pay

## 2020-01-19 VITALS — BP 120/78 | HR 90 | Temp 98.0°F | Resp 12 | Ht 72.0 in | Wt 179.0 lb

## 2020-01-19 DIAGNOSIS — L989 Disorder of the skin and subcutaneous tissue, unspecified: Secondary | ICD-10-CM

## 2020-01-19 DIAGNOSIS — Z1159 Encounter for screening for other viral diseases: Secondary | ICD-10-CM

## 2020-01-19 DIAGNOSIS — R0781 Pleurodynia: Secondary | ICD-10-CM

## 2020-01-19 DIAGNOSIS — N342 Other urethritis: Secondary | ICD-10-CM

## 2020-01-19 DIAGNOSIS — Z9189 Other specified personal risk factors, not elsewhere classified: Secondary | ICD-10-CM

## 2020-01-19 NOTE — Patient Instructions (Addendum)
A few things to remember from today's visit:  Complete Lamisil. Re-check for chlamydia and gonorrhea in 2-3 months. Sex partners must also be treated.  Safe Sex Practicing safe sex means taking steps before and during sex to reduce your risk of:  Getting an STI (sexually transmitted infection).  Giving your partner an STI.  Unwanted or unplanned pregnancy. How can I practice safe sex?     Ways you can practice safe sex  Limit your sexual partners to only one partner who is having sex with only you.  Avoid using alcohol and drugs before having sex. Alcohol and drugs can affect your judgment.  Before having sex with a new partner: ? Talk to your partner about past partners, past STIs, and drug use. ? Get screened for STIs and discuss the results with your partner. Ask your partner to get screened, too.  Check your body regularly for sores, blisters, rashes, or unusual discharge. If you notice any of these problems, visit your health care provider.  Avoid sexual contact if you have symptoms of an infection or you are being treated for an STI.  While having sex, use a condom. Make sure to: ? Use a condom every time you have vaginal, oral, or anal sex. Both females and males should wear condoms during oral sex. ? Keep condoms in place from the beginning to the end of sexual activity. ? Use a latex condom, if possible. Latex condoms offer the best protection. ? Use only water-based lubricants with a condom. Using petroleum-based lubricants or oils will weaken the condom and increase the chance that it will break. Ways your health care provider can help you practice safe sex  See your health care provider for regular screenings, exams, and tests for STIs.  Talk with your health care provider about what kind of birth control (contraception) is best for you.  Get vaccinated against hepatitis B and human papillomavirus (HPV).  If you are at risk of being infected with HIV (human  immunodeficiency virus), talk with your health care provider about taking a prescription medicine to prevent HIV infection. You are at risk for HIV if you: ? Are a man who has sex with other men. ? Are sexually active with more than one partner. ? Take drugs by injection. ? Have a sex partner who has HIV. ? Have unprotected sex. ? Have sex with someone who has sex with both men and women. ? Have had an STI. Follow these instructions at home:  Take over-the-counter and prescription medicines as told by your health care provider.  Keep all follow-up visits as told by your health care provider. This is important. Where to find more information  Centers for Disease Control and Prevention: LessFurniture.be  Planned Parenthood: https://www.plannedparenthood.org/  Office on Women's Health: EmploymentTracking.tn Summary  Practicing safe sex means taking steps before and during sex to reduce your risk of STIs, giving your partner STIs, and having an unwanted or unplanned pregnancy.  Before having sex with a new partner, talk to your partner about past partners, past STIs, and drug use.  Use a condom every time you have vaginal, oral, or anal sex. Both females and males should wear condoms during oral sex.  Check your body regularly for sores, blisters, rashes, or unusual discharge. If you notice any of these problems, visit your health care provider.  See your health care provider for regular screenings, exams, and tests for STIs. This information is not intended to replace advice given to you by  your health care provider. Make sure you discuss any questions you have with your health care provider. Document Revised: 10/30/2018 Document Reviewed: 04/20/2018 Elsevier Patient Education  2020 [employer].

## 2020-01-19 NOTE — Progress Notes (Signed)
HPI: [name] is a 39 y.o. male, who is here today to follow on recent ER visit. He was evaluated in the ER on 01/07/20 for urethral discharge. Swab test positive for chlamydia and gonorrhea. He received Rocephin 500 mg IM and started on Doxycycline,which he started 4 days ago.  He was also c/o pruritus in between toes.  Started on Lamisil and according to pt,he was told to follow to have liver "check." He has not noted N/V or jaundice. Tolerating Lamisil well. He was evaluated for similar concern on 09/18/19, then he tested positive for chlamydia and trichomonas.  He has 2 sexual partners. He states that they have been treated as recommended.  Urethral discharged has resolved. Denies dysuria,gross hematuria,frequency,or genital lesions. Negative for fever,chills,abdominal pain,arthrlagias,or skin rash.  He is concerned about tender rib and lesion above right rib cage, "knot." He noted problem "a while" back. Pain is intermittent, sometimes lesion seems bigger. He has not noted skin erythema or drainage. No associated night sweats,wt loss, cough,wheezing,of SOB. He has not tried OTC treatments. No Hx of trauma.  Review of Systems  Constitutional: Negative for appetite change and fatigue.  HENT: Negative for mouth sores, nosebleeds and sore throat.   Cardiovascular: Negative for chest pain, palpitations and leg swelling.  Gastrointestinal:       No changes in bowel habits.  Genitourinary: Negative for decreased urine volume, flank pain, scrotal swelling and urgency.  Musculoskeletal: Negative for back pain and myalgias.  Neurological: Negative for syncope, weakness and headaches.  Rest see pertinent positives and negatives per HPI.  Current Outpatient Medications on File Prior to Visit  Medication Sig Dispense Refill  . clotrimazole-betamethasone (LOTRISONE) cream Apply 1 application topically 2 (two) times daily as needed. 45 g 1  . terbinafine (LAMISIL)  250 MG tablet Take 1 tablet (250 mg total) by mouth daily for 14 days. 14 tablet 0   No current facility-administered medications on file prior to visit.   History reviewed. No pertinent past medical history. No Known Allergies  Social History   Socioeconomic History  . Marital status: Married    Spouse name: Not on file  . Number of children: Not on file  . Years of education: Not on file  . Highest education level: Not on file  Occupational History  . Not on file  Tobacco Use  . Smoking status: Current Every Day Smoker  . Smokeless tobacco: Never Used  Substance and Sexual Activity  . Alcohol use: Yes  . Drug use: Yes    Types: Marijuana  . Sexual activity: Not on file  Other Topics Concern  . Not on file  Social History Narrative  . Not on file   Social Determinants of Health   Financial Resource Strain:   . Difficulty of Paying Living Expenses:   Food Insecurity:   . Worried About Programme researcher, broadcasting/film/video in the Last Year:   . Barista in the Last Year:   Transportation Needs:   . Freight forwarder (Medical):   Marland Kitchen Lack of Transportation (Non-Medical):   Physical Activity:   . Days of Exercise per Week:   . Minutes of Exercise per Session:   Stress:   . Feeling of Stress :   Social Connections:   . Frequency of Communication with Friends and Family:   . Frequency of Social Gatherings with Friends and Family:   . Attends Religious Services:   . Active Member of Clubs or  Organizations:   . Attends Banker Meetings:   Marland Kitchen Marital Status:     Vitals:   01/19/20 1103  BP: 120/78  Pulse: 90  Resp: 12  Temp: 98 F (36.7 C)  SpO2: 99%   Body mass index is 24.28 kg/m.  Physical Exam Nursing note reviewed.  Constitutional:      General: He is not in acute distress.    Appearance: He is well-developed.  HENT:     Head: Normocephalic and atraumatic.     Mouth/Throat:     Mouth: Mucous membranes are moist.     Pharynx: Oropharynx is  clear.  Eyes:     Conjunctiva/sclera: Conjunctivae normal.  Cardiovascular:     Rate and Rhythm: Normal rate and regular rhythm.     Heart sounds: No murmur heard.   Pulmonary:     Effort: Pulmonary effort is normal. No respiratory distress.     Breath sounds: Normal breath sounds.  Chest:       Comments: 1.5 cm raised soft lesion palpated on 6th rib, mildly tender. No skin changes. Abdominal:     Palpations: Abdomen is soft. There is no hepatomegaly or mass.     Tenderness: There is no abdominal tenderness.  Lymphadenopathy:     Cervical: No cervical adenopathy.  Skin:    General: Skin is warm.     Findings: No ecchymosis, erythema or rash.  Neurological:     General: No focal deficit present.     Mental Status: He is alert and oriented to person, place, and time.     Cranial Nerves: No cranial nerve deficit.     Gait: Gait normal.  Psychiatric:     Comments: Well groomed, good eye contact.    ASSESSMENT AND PLAN: Ms.Edward Blackburn was seen today for follow-up. Diagnoses and all orders for this visit:  Orders Placed This Encounter  Procedures  . DG Ribs Unilateral Right  . Hepatic function panel  . Hepatitis C antibody    Rib pain on right side Hx and examination today do not suggest a serious process.       Rib X ray ordered,instructed to go to [facility] to have it done.  Lesion of subcutaneous tissue Most likely benign. ?Fibroma,sebaceous cyst. He agrees with continue monitoring for changes.  Encounter for HCV screening test for high risk patient -     Hepatitis C antibody  Urethritis Coinfection of GC and CT. Complete Doxycycline treatment. Educated about STD prevention and safe sex practices   Return in about 2 months (around 03/20/2020), or if symptoms worsen or fail to improve.   Edward Kienast G. [place], MD  [facility]. [place] office.

## 2020-03-20 ENCOUNTER — Other Ambulatory Visit (HOSPITAL_COMMUNITY)
Admission: RE | Admit: 2020-03-20 | Discharge: 2020-03-20 | Disposition: A | Discharge status: Home / Self Care | Payer: BC Managed Care – PPO | Source: Ambulatory Visit | Attending: Family Medicine | Admitting: Family Medicine

## 2020-03-20 ENCOUNTER — Other Ambulatory Visit: Payer: Self-pay

## 2020-03-20 ENCOUNTER — Encounter: Payer: Self-pay | Admitting: Family Medicine

## 2020-03-20 ENCOUNTER — Ambulatory Visit (INDEPENDENT_AMBULATORY_CARE_PROVIDER_SITE_OTHER): Payer: BC Managed Care – PPO | Admitting: Family Medicine

## 2020-03-20 VITALS — BP 110/74 | HR 78 | Resp 12 | Ht 72.0 in | Wt 176.0 lb

## 2020-03-20 DIAGNOSIS — R222 Localized swelling, mass and lump, trunk: Secondary | ICD-10-CM | POA: Diagnosis not present

## 2020-03-20 DIAGNOSIS — Z113 Encounter for screening for infections with a predominantly sexual mode of transmission: Secondary | ICD-10-CM | POA: Insufficient documentation

## 2020-03-20 DIAGNOSIS — M545 Low back pain: Secondary | ICD-10-CM | POA: Diagnosis not present

## 2020-03-20 DIAGNOSIS — B353 Tinea pedis: Secondary | ICD-10-CM | POA: Diagnosis not present

## 2020-03-20 DIAGNOSIS — G8929 Other chronic pain: Secondary | ICD-10-CM

## 2020-03-20 MED ORDER — CLOTRIMAZOLE-BETAMETHASONE 1-0.05 % EX CREA: 1.0000 "application " | TOPICAL_CREAM | Freq: Two times a day (BID) | CUTANEOUS | 1 refills | Status: DC | PRN

## 2020-03-20 NOTE — Progress Notes (Signed)
HPI: [name] is a 39 y.o. male, who is here today for  follow up.   Edward Blackburn was last seen on 01/19/20. No new problem since his last visit.  Edward Blackburn is still concerned about tender lesion in right costal area. Painful with light touch. No growth or skin changes. + Smoker. Negative for cough,SOB,wheezing,or CP.  Rib X ray done on 01/19/20 negative for bone abnormalities.   On 01/07/2020 Edward Blackburn was treated for chlamydia and gonorrhea. Edward Blackburn completed treatment. Denies urethral discharge,genital lesions,or dysuria.  Edward Blackburn completed treatment with Lamisil for foot rash,temporal relief but problem is recurrent. Sin lesions in between toes, occasional pruritus. No edema,erythemaor drainage. + Pigmentation changes.  C/O low back pain, which we discussed in 08/2019. Lumbar MRI was ordered and Edward Blackburn states that Edward Blackburn has not received appt information. 3-4 years of bilateral LE's numbness from knee down. Mild lower back pain. LE's discomfort is exacerbated by certain activities as running and alleviated by stretching. Negative for saddle anesthesia and bladder/bowel dysfunction. No new associated symptoms.  Review of Systems  Constitutional: Negative for activity change, appetite change, fatigue, fever and unexpected weight change.  HENT: Negative for mouth sores and sore throat.   Gastrointestinal: Negative for abdominal pain, nausea and vomiting.  Genitourinary: Negative for scrotal swelling and testicular pain.  Skin: Negative for pallor and rash.  Neurological: Negative for syncope and weakness.  Rest of ROS, see pertinent positives sand negatives in HPI  No current outpatient medications on file prior to visit.   No current facility-administered medications on file prior to visit.   History reviewed. No pertinent past medical history. No Known Allergies  Social History   Socioeconomic History   Marital status: Married    Spouse name: Not on file   Number of children: Not on file    Years of education: Not on file   Highest education level: Not on file  Occupational History   Not on file  Tobacco Use   Smoking status: Current Every Day Smoker   Smokeless tobacco: Never Used  Substance and Sexual Activity   Alcohol use: Yes   Drug use: Yes    Types: Marijuana   Sexual activity: Not on file  Other Topics Concern   Not on file  Social History Narrative   Not on file   Social Determinants of Health   Financial Resource Strain:    Difficulty of Paying Living Expenses: Not on file  Food Insecurity:    Worried About Running Out of Food in the Last Year: Not on file   Ran Out of Food in the Last Year: Not on file  Transportation Needs:    Lack of Transportation (Medical): Not on file   Lack of Transportation (Non-Medical): Not on file  Physical Activity:    Days of Exercise per Week: Not on file   Minutes of Exercise per Session: Not on file  Stress:    Feeling of Stress : Not on file  Social Connections:    Frequency of Communication with Friends and Family: Not on file   Frequency of Social Gatherings with Friends and Family: Not on file   Attends Religious Services: Not on file   Active Member of Clubs or Organizations: Not on file   Attends Banker Meetings: Not on file   Marital Status: Not on file    Vitals:   03/20/20 1032  BP: 110/74  Pulse: 78  Resp: 12  SpO2: 99%   Body mass index  is 23.87 kg/m.  Physical Exam Vitals and nursing note reviewed.  Constitutional:      General: Edward Blackburn is not in acute distress.    Appearance: Edward Blackburn is well-developed.  HENT:     Head: Normocephalic and atraumatic.  Eyes:     Conjunctiva/sclera: Conjunctivae normal.  Cardiovascular:     Rate and Rhythm: Normal rate and regular rhythm.     Heart sounds: No murmur heard.   Pulmonary:     Effort: Pulmonary effort is normal. No respiratory distress.     Breath sounds: Normal breath sounds.  Chest:       Comments: Right  costal area above rib cage a mobile mass, 3-4 cm max diameter, defined borders and tender. No skin changes. Musculoskeletal:     Lumbar back: No tenderness or bony tenderness.  Lymphadenopathy:     Cervical: No cervical adenopathy.  Skin:    General: Skin is warm.     Comments: Hyperpigmentation on dorsal aspect of toes, no tenderness or edema. In between most toes whitish macerated tissue, no tenderness or drainage.  Neurological:     Mental Status: Edward Blackburn is alert.  Psychiatric:     Comments: Well groomed, good eye contact.   ASSESSMENT AND PLAN:  [name] was seen today for follow-up.  Orders Placed This Encounter  Procedures   Ambulatory referral to General Surgery    Mass in chest Most likely benign, ? Lipoma. We discussed options: CT vs surgical evaluation. Edward Blackburn would like lesion remove , so surgical evaluation will be arranged.  Chronic low back pain, unspecified back pain laterality, unspecified whether sciatica present Stable. We will try to find out what happened with lumbar MRI order.  Tinea pedis of both feet I do not recommend repeating oral treatment. Recommend maintaining area dry, Lotrisone small amount bid for 14 days at the time. Soaking feet in vinegar with water mix a few times per week may also help.  -     clotrimazole-betamethasone (LOTRISONE) cream; Apply 1 application topically 2 (two) times daily as needed.  Screen for STD (sexually transmitted disease) 2-3 months since Edward Blackburn completed treatment. Asymptomatic. Encouraged condom use.   Return if symptoms worsen or fail to improve.   [name], MD  [facility]. [place] office.    A few things to remember from today's visit:  Chronic low back pain, unspecified back pain laterality, unspecified whether sciatica present  Tinea pedis of both feet - Plan: clotrimazole-betamethasone (LOTRISONE) cream  Screen for STD (sexually transmitted disease) - Plan: Urine  cytology ancillary only  Mass in chest - Plan: Ambulatory referral to General Surgery  Soaking feet with vinegar and water 1/2 and 1/2 a few times per week may also help. Small amount of cream.  Will try to find out what happen with lumbar MRI.  Athlete's Foot  Athlete's foot (tinea pedis) is a fungal infection of the skin on your feet. It often occurs on the skin that is between or underneath your toes. It can also occur on the soles of your feet. Symptoms include itchy or white and flaky areas on the skin. The infection can spread from person to person (is contagious). It can also spread when a person's bare feet come in contact with the fungus on shower floors or on items such as shoes. Follow these instructions at home: Medicines  Apply or take over-the-counter and prescription medicines only as told by your doctor.  Apply your antifungal medicine as told by  your doctor. Do not stop using the medicine even if your feet start to get better. Foot care  Do not scratch your feet.  Keep your feet dry: ? Wear cotton or wool socks. Change your socks every day or if they become wet. ? Wear shoes that allow air to move around, such as sandals or canvas tennis shoes.  Wash and dry your feet: ? Every day or as told by your doctor. ? After exercising. ? Including the area between your toes. General instructions  Do not share any of these items that touch your feet: ? Towels. ? Shoes. ? Nail clippers. ? Other personal items.  Protect your feet by wearing sandals in wet areas, such as locker rooms and shared showers.  Keep all follow-up visits as told by your doctor. This is important.  If you have diabetes, keep your blood sugar under control. Contact a doctor if:  You have a fever.  You have swelling, pain, warmth, or redness in your foot.  Your feet are not getting better with treatment.  Your symptoms get worse.  You have new symptoms. Summary  Athlete's foot is a  fungal infection of the skin on your feet.  Symptoms include itchy or white and flaky areas on the skin.  Apply your antifungal medicine as told by your doctor.  Keep your feet clean and dry. This information is not intended to replace advice given to you by your health care provider. Make sure you discuss any questions you have with your health care provider. Document Revised: 07/03/2017 Document Reviewed: 04/28/2017 Elsevier Patient Education  2020 [employer].  If you need refills please call your pharmacy. Do not use My Chart to request refills or for acute issues that need immediate attention.    Please be sure medication list is accurate. If a new problem present, please set up appointment sooner than planned today.

## 2020-03-20 NOTE — Patient Instructions (Signed)
A few things to remember from today's visit:   Rib pain on right side  Chronic low back pain, unspecified back pain laterality, unspecified whether sciatica present  Tinea pedis of both feet - Plan: clotrimazole-betamethasone (LOTRISONE) cream  Screen for STD (sexually transmitted disease) - Plan: Urine cytology ancillary only  Mass in chest - Plan: Ambulatory referral to General Surgery  Soaking feet with vinegar and water 1/2 and 1/2 a few times per week may also help. Small amount of cream.  Will try to find out what happen with lumbar MRI.  Athlete's Foot  Athlete's foot (tinea pedis) is a fungal infection of the skin on your feet. It often occurs on the skin that is between or underneath your toes. It can also occur on the soles of your feet. Symptoms include itchy or white and flaky areas on the skin. The infection can spread from person to person (is contagious). It can also spread when a person's bare feet come in contact with the fungus on shower floors or on items such as shoes. Follow these instructions at home: Medicines  Apply or take over-the-counter and prescription medicines only as told by your doctor.  Apply your antifungal medicine as told by your doctor. Do not stop using the medicine even if your feet start to get better. Foot care  Do not scratch your feet.  Keep your feet dry: ? Wear cotton or wool socks. Change your socks every day or if they become wet. ? Wear shoes that allow air to move around, such as sandals or canvas tennis shoes.  Wash and dry your feet: ? Every day or as told by your doctor. ? After exercising. ? Including the area between your toes. General instructions  Do not share any of these items that touch your feet: ? Towels. ? Shoes. ? Nail clippers. ? Other personal items.  Protect your feet by wearing sandals in wet areas, such as locker rooms and shared showers.  Keep all follow-up visits as told by your doctor. This is  important.  If you have diabetes, keep your blood sugar under control. Contact a doctor if:  You have a fever.  You have swelling, pain, warmth, or redness in your foot.  Your feet are not getting better with treatment.  Your symptoms get worse.  You have new symptoms. Summary  Athlete's foot is a fungal infection of the skin on your feet.  Symptoms include itchy or white and flaky areas on the skin.  Apply your antifungal medicine as told by your doctor.  Keep your feet clean and dry. This information is not intended to replace advice given to you by your health care provider. Make sure you discuss any questions you have with your health care provider. Document Revised: 07/03/2017 Document Reviewed: 04/28/2017 Elsevier Patient Education  2020 [employer].  If you need refills please call your pharmacy. Do not use My Chart to request refills or for acute issues that need immediate attention.    Please be sure medication list is accurate. If a new problem present, please set up appointment sooner than planned today.

## 2020-03-21 LAB — URINE CYTOLOGY ANCILLARY ONLY
Chlamydia: POSITIVE — AB
Comment: NEGATIVE
Comment: NEGATIVE
Comment: NORMAL
Neisseria Gonorrhea: NEGATIVE
Trichomonas: NEGATIVE

## 2020-03-22 ENCOUNTER — Encounter: Payer: Self-pay | Admitting: Family Medicine

## 2020-03-23 MED ORDER — AZITHROMYCIN 500 MG PO TABS: 1000.0000 mg | ORAL_TABLET | Freq: Once | ORAL | 0 refills | Status: AC

## 2020-03-24 ENCOUNTER — Other Ambulatory Visit: Payer: Self-pay

## 2020-03-29 DIAGNOSIS — Z20822 Contact with and (suspected) exposure to covid-19: Secondary | ICD-10-CM | POA: Diagnosis not present

## 2020-04-04 ENCOUNTER — Telehealth: Payer: Self-pay | Admitting: Family Medicine

## 2020-04-04 NOTE — Telephone Encounter (Signed)
Patient would like to do the CT instead. Okay to place order?

## 2020-04-04 NOTE — Telephone Encounter (Signed)
Pt stated he would like CT scan for the mass in his chest instead of jumping to surgery. He needs the referral for CT instead   Pt can be reached at [phone]

## 2020-04-07 ENCOUNTER — Ambulatory Visit (INDEPENDENT_AMBULATORY_CARE_PROVIDER_SITE_OTHER): Payer: BC Managed Care – PPO | Admitting: Family Medicine

## 2020-04-07 ENCOUNTER — Encounter: Payer: Self-pay | Admitting: Family Medicine

## 2020-04-07 ENCOUNTER — Ambulatory Visit: Payer: BC Managed Care – PPO | Admitting: Family Medicine

## 2020-04-07 ENCOUNTER — Other Ambulatory Visit: Payer: Self-pay

## 2020-04-07 VITALS — BP 120/70 | HR 73 | Temp 98.6°F | Ht 72.0 in | Wt 180.0 lb

## 2020-04-07 DIAGNOSIS — R222 Localized swelling, mass and lump, trunk: Secondary | ICD-10-CM

## 2020-04-07 DIAGNOSIS — Z202 Contact with and (suspected) exposure to infections with a predominantly sexual mode of transmission: Secondary | ICD-10-CM | POA: Diagnosis not present

## 2020-04-07 DIAGNOSIS — R0781 Pleurodynia: Secondary | ICD-10-CM

## 2020-04-07 MED ORDER — DOXYCYCLINE HYCLATE 100 MG PO TABS: 100.0000 mg | ORAL_TABLET | Freq: Two times a day (BID) | ORAL | 0 refills | Status: AC

## 2020-04-07 NOTE — Telephone Encounter (Signed)
Will discuss with pt at his appointment today.

## 2020-04-07 NOTE — Patient Instructions (Signed)
A few things to remember from today's visit:   Mass in chest - Plan: [place] CHEST SOFT TISSUE  Rib pain on right side - Plan: [place] CHEST SOFT TISSUE  Please schedule appt with general surgeon. Chlamydia, Male  Chlamydia is an STD (sexually transmitted disease). It is a bacterial infection that spreads through sexual contact (is contagious). Chlamydia can occur in different areas of the body, including the tube that moves urine from the bladder out of the body (urethra), the throat, or the rectum. This condition is not difficult to treat. However, if left untreated, chlamydia can lead to more serious health problems. What are the causes? Chlamydia is caused by the bacteria Chlamydia trachomatis. It is passed from an infected partner during sexual activity. Chlamydia can spread through contact with the genitals, mouth, or rectum. What are the signs or symptoms? In some cases, there may not be any symptoms for this condition (asymptomatic), especially early in the infection. If symptoms develop, they may include:  Burning when urinating.  Urinating frequently.  Pain or swelling in the testicles.  Watery, mucus-like discharge from the penis.  Redness, soreness, and swelling (inflammation) of the rectum.  Bleeding or discharge from the rectum.  Abdominal pain.  Itching, burning, or redness in the eyes, or discharge from the eyes. How is this diagnosed? This condition may be diagnosed based on:  Urine tests.  Swab tests. Depending on your symptoms, your health care provider may use a cotton swab to collect discharge from your urethra or rectum to test for the bacteria. How is this treated? This condition is treated with antibiotic medicines. Follow these instructions at home: Medicines  Take over-the-counter and prescription medicines only as told by your health care provider.  Take your antibiotic medicine as told by your health care provider. Do not stop taking the antibiotic even  if you start to feel better. Sexual activity  Tell sexual partners about your infection. This includes any oral, anal, or vaginal sex partners you have had within 60 days of when your symptoms started. Sexual partners should also be treated, even if they have no signs of the disease.  Do not have sex until you and your sexual partners have completed treatment and your health care provider says it is okay. If your health care provider prescribed you a single dose treatment, wait 7 days after taking the treatment before having sex. General instructions  It is your responsibility to get your test results. Ask your health care provider, or the department performing the test, when your results will be ready.  Get plenty of rest.  Eat a healthy, well-balanced diet.  Drink enough fluids to keep your urine clear or pale yellow.  Keep all follow-up visits as told by your health care provider. This is important. You may need to be tested for infection again 3 months after treatment. How is this prevented? The only sure way to prevent chlamydia is to avoid sexual intercourse. However, you can lower your risk by:  Using latex condoms correctly every time you have sexual intercourse.  Not having multiple sexual partners.  Asking if your sexual partner has been tested for STIs and had negative results. Contact a health care provider if:  You develop new symptoms or your symptoms do not get better after completing treatment.  You have a fever or chills.  You have pain during sexual intercourse.  You develop new joint pain or swelling near your joints.  You have pain or soreness in your  testicles. Get help right away if:  Your pain gets worse and does not get better with medicine.  You have abnormal discharge.  You develop flu-like symptoms, such as night sweats, sore throat, or muscle aches. Summary  Chlamydia is an STD (sexually transmitted disease). It is a bacterial infection that  spreads (is contagious) through sexual contact.  This condition is not difficult to treat, however, if left untreated, it can lead to more serious health problems.  In some cases, there may not be any symptoms for this condition (asymptomatic).  This condition is treated with antibiotic medicines.  Using latex condoms correctly every time you have sexual intercourse can help prevent chlamydia. This information is not intended to replace advice given to you by your health care provider. Make sure you discuss any questions you have with your health care provider. Document Revised: 07/23/2017 Document Reviewed: 06/24/2016 Elsevier Patient Education  2020 [employer].  Please be sure medication list is accurate. If a new problem present, please set up appointment sooner than planned today.

## 2020-04-07 NOTE — Progress Notes (Signed)
Chief Complaint  Patient presents with  . Follow-up    wants CT scan done instead of surgery referral   HPI: EdwardEdward Edward Moodarker Sr. is a 39 y.o. male, who is here today for follow up.   She was last seen on 03/20/20, when he c/o tender nodular lesion on right costal area.   Problem has been going on for about 2 years, gradually increasing in size. He was referred to surgery.  Negative for fever, chills, abnormal weight loss, cough, wheezing, dyspnea, or abdominal pain. Pain is exacerbated by light touch. He received a call from general surgeon's office, he did not schedule appointment.He wants to have imaging sone instead.  Lab Results  Component Value Date   WBC 9.8 01/06/2019   HGB 13.5 01/06/2019   HCT 42.1 01/06/2019   MCV 81.3 01/06/2019   PLT 309 01/06/2019   Lab Results  Component Value Date   CREATININE 0.76 10/05/2019   BUN 7 10/05/2019   NA 139 10/05/2019   K 4.1 10/05/2019   CL 103 10/05/2019   CO2 30 10/05/2019   Lab Results  Component Value Date   ALT 15 01/06/2019   AST 18 01/06/2019   ALKPHOS 44 01/06/2019   BILITOT 0.3 01/06/2019   He is also concerned about chlamydia infection. According to patient, his son picked up medication for him, he was stopped by the police for speeding, medication was taken from him.   Initially treated for chlamydia in the ER on 01/07/20, completed Doxycycline.  Re-checked on 03/20/20 and positive for chlamydia, Azithromycin was recommended.  He has not been consistent with condom use. His girlfriend was also treated in 12/2019 and recently diagnosed with chlamydia infection. He has not noted dysuria, urethral discharge, genital lesions. Has had other STDs in the past.  Review of Systems  Constitutional: Negative for appetite change and fatigue.  HENT: Negative for mouth sores and sore throat.   Gastrointestinal: Negative for nausea and vomiting.  Genitourinary: Negative for decreased urine volume, hematuria and  testicular pain.  Musculoskeletal: Negative for arthralgias and joint swelling.  Skin: Negative for rash and wound.  Neurological: Negative for tremors, weakness and headaches.  Rest of ROS, see pertinent positives sand negatives in HPI  Current Outpatient Medications on File Prior to Visit  Medication Sig Dispense Refill  . clotrimazole-betamethasone (LOTRISONE) cream Apply 1 application topically 2 (two) times daily as needed. 45 g 1   No current facility-administered medications on file prior to visit.     History reviewed. No pertinent past medical history. No Known Allergies  Social History   Socioeconomic History  . Marital status: Married    Spouse name: Not on file  . Number of children: Not on file  . Years of education: Not on file  . Highest education level: Not on file  Occupational History  . Not on file  Tobacco Use  . Smoking status: Current Every Day Smoker  . Smokeless tobacco: Never Used  Substance and Sexual Activity  . Alcohol use: Yes  . Drug use: Yes    Types: Marijuana  . Sexual activity: Not on file  Other Topics Concern  . Not on file  Social History Narrative  . Not on file   Social Determinants of Health   Financial Resource Strain:   . Difficulty of Paying Living Expenses: Not on file  Food Insecurity:   . Worried About Programme researcher, broadcasting/film/videounning Out of Food in the Last Year: Not on file  . Ran Out  of Food in the Last Year: Not on file  Transportation Needs:   . Lack of Transportation (Medical): Not on file  . Lack of Transportation (Non-Medical): Not on file  Physical Activity:   . Days of Exercise per Week: Not on file  . Minutes of Exercise per Session: Not on file  Stress:   . Feeling of Stress : Not on file  Social Connections:   . Frequency of Communication with Friends and Family: Not on file  . Frequency of Social Gatherings with Friends and Family: Not on file  . Attends Religious Services: Not on file  . Active Member of Clubs or  Organizations: Not on file  . Attends Banker Meetings: Not on file  . Marital Status: Not on file    Vitals:   04/07/20 1053  BP: 120/70  Pulse: 73  Temp: 98.6 F (37 C)  SpO2: 96%   Body mass index is 24.41 kg/m.   Physical Exam Vitals and nursing note reviewed.  Constitutional:      General: He is not in acute distress.    Appearance: He is well-developed.  HENT:     Head: Normocephalic and atraumatic.     Mouth/Throat:     Mouth: Mucous membranes are moist.     Pharynx: Oropharynx is clear.  Eyes:     Conjunctiva/sclera: Conjunctivae normal.  Cardiovascular:     Rate and Rhythm: Normal rate and regular rhythm.     Heart sounds: No murmur heard.   Pulmonary:     Effort: Pulmonary effort is normal. No respiratory distress.     Breath sounds: Normal breath sounds.  Chest:    Genitourinary:    Penis: No erythema, tenderness, discharge or lesions.      Comments: Urethral swab collected. Lymphadenopathy:     Cervical: No cervical adenopathy.     Upper Body:     Right upper body: No supraclavicular adenopathy.     Left upper body: No supraclavicular adenopathy.     Lower Body: No right inguinal adenopathy. No left inguinal adenopathy.  Skin:    General: Skin is warm.     Findings: No ecchymosis, erythema or rash.     Comments: No skin changes,fluctuant area,or local heat. Defined borders. Nickel size. Not very mobile,painful. See picture.  Neurological:     General: No focal deficit present.     Mental Status: He is alert and oriented to person, place, and time.     Gait: Gait normal.  Psychiatric:        Mood and Affect: Mood and affect normal.      ASSESSMENT AND PLAN:  Mr. Edward Durnil Sr. was seen today for follow-up.  Orders Placed This Encounter  Procedures  . [place] CHEST SOFT TISSUE   Mass in chest We discussed possible etiologies, most likely benign.  Recommend calling surgeon's office to schedule appointment, lesion is  causing pain,so it still needs to be removed. We will schedule soft tissue US.  Rib pain on right side Around lesion. Rib X ray done on 01/19/20 negative for lytic lesion.  Exposure to chlamydia Hx of STD's. Educated about prevention. Encouraged to use condoms. Doxycycline 100 mg twice daily for 7 days started today. Further recommendation will be given according to with flu swab results.  -     doxycycline (VIBRA-TABS) 100 MG tablet; Take 1 tablet (100 mg total) by mouth 2 (two) times daily for 7 days.   Return if symptoms worsen or fail  to improve.   Tamaria Dunleavy G. [place], MD  [facility]. [place] office.   A few things to remember from today's visit:   Mass in chest - Plan: [place] CHEST SOFT TISSUE  Rib pain on right side - Plan: [place] CHEST SOFT TISSUE  Please schedule appt with general surgeon. Chlamydia, Male  Chlamydia is an STD (sexually transmitted disease). It is a bacterial infection that spreads through sexual contact (is contagious). Chlamydia can occur in different areas of the body, including the tube that moves urine from the bladder out of the body (urethra), the throat, or the rectum. This condition is not difficult to treat. However, if left untreated, chlamydia can lead to more serious health problems. What are the causes? Chlamydia is caused by the bacteria Chlamydia trachomatis. It is passed from an infected partner during sexual activity. Chlamydia can spread through contact with the genitals, mouth, or rectum. What are the signs or symptoms? In some cases, there may not be any symptoms for this condition (asymptomatic), especially early in the infection. If symptoms develop, they may include:  Burning when urinating.  Urinating frequently.  Pain or swelling in the testicles.  Watery, mucus-like discharge from the penis.  Redness, soreness, and swelling (inflammation) of the rectum.  Bleeding or discharge from the rectum.  Abdominal  pain.  Itching, burning, or redness in the eyes, or discharge from the eyes. How is this diagnosed? This condition may be diagnosed based on:  Urine tests.  Swab tests. Depending on your symptoms, your health care provider may use a cotton swab to collect discharge from your urethra or rectum to test for the bacteria. How is this treated? This condition is treated with antibiotic medicines. Follow these instructions at home: Medicines  Take over-the-counter and prescription medicines only as told by your health care provider.  Take your antibiotic medicine as told by your health care provider. Do not stop taking the antibiotic even if you start to feel better. Sexual activity  Tell sexual partners about your infection. This includes any oral, anal, or vaginal sex partners you have had within 60 days of when your symptoms started. Sexual partners should also be treated, even if they have no signs of the disease.  Do not have sex until you and your sexual partners have completed treatment and your health care provider says it is okay. If your health care provider prescribed you a single dose treatment, wait 7 days after taking the treatment before having sex. General instructions  It is your responsibility to get your test results. Ask your health care provider, or the department performing the test, when your results will be ready.  Get plenty of rest.  Eat a healthy, well-balanced diet.  Drink enough fluids to keep your urine clear or pale yellow.  Keep all follow-up visits as told by your health care provider. This is important. You may need to be tested for infection again 3 months after treatment. How is this prevented? The only sure way to prevent chlamydia is to avoid sexual intercourse. However, you can lower your risk by:  Using latex condoms correctly every time you have sexual intercourse.  Not having multiple sexual partners.  Asking if your sexual partner has been  tested for STIs and had negative results. Contact a health care provider if:  You develop new symptoms or your symptoms do not get better after completing treatment.  You have a fever or chills.  You have pain during sexual intercourse.  You  develop new joint pain or swelling near your joints.  You have pain or soreness in your testicles. Get help right away if:  Your pain gets worse and does not get better with medicine.  You have abnormal discharge.  You develop flu-like symptoms, such as night sweats, sore throat, or muscle aches. Summary  Chlamydia is an STD (sexually transmitted disease). It is a bacterial infection that spreads (is contagious) through sexual contact.  This condition is not difficult to treat, however, if left untreated, it can lead to more serious health problems.  In some cases, there may not be any symptoms for this condition (asymptomatic).  This condition is treated with antibiotic medicines.  Using latex condoms correctly every time you have sexual intercourse can help prevent chlamydia. This information is not intended to replace advice given to you by your health care provider. Make sure you discuss any questions you have with your health care provider. Document Revised: 07/23/2017 Document Reviewed: 06/24/2016 Elsevier Patient Education  2020 [employer].  Please be sure medication list is accurate. If a new problem present, please set up appointment sooner than planned today.

## 2020-04-10 ENCOUNTER — Other Ambulatory Visit: Payer: Self-pay | Admitting: Family Medicine

## 2020-04-10 DIAGNOSIS — Z202 Contact with and (suspected) exposure to infections with a predominantly sexual mode of transmission: Secondary | ICD-10-CM

## 2020-04-10 DIAGNOSIS — Z113 Encounter for screening for infections with a predominantly sexual mode of transmission: Secondary | ICD-10-CM

## 2020-04-11 ENCOUNTER — Other Ambulatory Visit: Payer: Self-pay

## 2020-04-11 ENCOUNTER — Other Ambulatory Visit: Payer: BC Managed Care – PPO

## 2020-04-11 DIAGNOSIS — Z202 Contact with and (suspected) exposure to infections with a predominantly sexual mode of transmission: Secondary | ICD-10-CM | POA: Diagnosis not present

## 2020-04-11 DIAGNOSIS — Z113 Encounter for screening for infections with a predominantly sexual mode of transmission: Secondary | ICD-10-CM

## 2020-04-13 LAB — C. TRACHOMATIS/N. GONORRHOEAE RNA
C. trachomatis RNA, TMA: NOT DETECTED
N. gonorrhoeae RNA, TMA: NOT DETECTED

## 2020-04-13 LAB — TRICHOMONAS VAGINALIS RNA, QL,MALES: Trichomonas vaginalis RNA: NOT DETECTED

## 2020-04-16 DIAGNOSIS — U071 COVID-19: Secondary | ICD-10-CM | POA: Diagnosis not present

## 2020-04-18 ENCOUNTER — Other Ambulatory Visit: Payer: BC Managed Care – PPO

## 2020-04-22 ENCOUNTER — Other Ambulatory Visit: Payer: BC Managed Care – PPO

## 2020-04-24 ENCOUNTER — Ambulatory Visit
Admission: RE | Admit: 2020-04-24 | Discharge: 2020-04-24 | Disposition: A | Discharge status: Home / Self Care | Payer: BC Managed Care – PPO | Source: Ambulatory Visit | Attending: Family Medicine | Admitting: Family Medicine

## 2020-04-24 ENCOUNTER — Other Ambulatory Visit: Payer: Self-pay

## 2020-04-24 DIAGNOSIS — G8929 Other chronic pain: Secondary | ICD-10-CM

## 2020-04-24 DIAGNOSIS — R202 Paresthesia of skin: Secondary | ICD-10-CM

## 2020-04-24 DIAGNOSIS — M48061 Spinal stenosis, lumbar region without neurogenic claudication: Secondary | ICD-10-CM | POA: Diagnosis not present

## 2020-04-25 ENCOUNTER — Ambulatory Visit
Admission: RE | Admit: 2020-04-25 | Discharge: 2020-04-25 | Disposition: A | Discharge status: Home / Self Care | Payer: BC Managed Care – PPO | Source: Ambulatory Visit | Attending: Family Medicine | Admitting: Family Medicine

## 2020-04-25 DIAGNOSIS — R221 Localized swelling, mass and lump, neck: Secondary | ICD-10-CM | POA: Diagnosis not present

## 2020-04-25 DIAGNOSIS — R222 Localized swelling, mass and lump, trunk: Secondary | ICD-10-CM

## 2020-04-25 DIAGNOSIS — R0781 Pleurodynia: Secondary | ICD-10-CM

## 2020-04-28 DIAGNOSIS — Z20822 Contact with and (suspected) exposure to covid-19: Secondary | ICD-10-CM | POA: Diagnosis not present

## 2020-05-01 ENCOUNTER — Other Ambulatory Visit: Payer: Self-pay | Admitting: Family Medicine

## 2020-05-01 DIAGNOSIS — R222 Localized swelling, mass and lump, trunk: Secondary | ICD-10-CM

## 2020-05-01 NOTE — Progress Notes (Signed)
hest  

## 2020-05-24 ENCOUNTER — Ambulatory Visit: Payer: BC Managed Care – PPO | Admitting: Family Medicine

## 2020-05-29 ENCOUNTER — Ambulatory Visit: Payer: BC Managed Care – PPO | Admitting: Family Medicine

## 2020-05-29 DIAGNOSIS — Z1159 Encounter for screening for other viral diseases: Secondary | ICD-10-CM

## 2020-06-12 ENCOUNTER — Ambulatory Visit (INDEPENDENT_AMBULATORY_CARE_PROVIDER_SITE_OTHER): Payer: BC Managed Care – PPO | Admitting: Family Medicine

## 2020-06-12 ENCOUNTER — Other Ambulatory Visit: Payer: Self-pay

## 2020-06-12 VITALS — BP 122/80 | HR 84 | Temp 98.7°F | Resp 16 | Ht 73.0 in | Wt 186.6 lb

## 2020-06-12 DIAGNOSIS — F17200 Nicotine dependence, unspecified, uncomplicated: Secondary | ICD-10-CM

## 2020-06-12 DIAGNOSIS — R222 Localized swelling, mass and lump, trunk: Secondary | ICD-10-CM | POA: Diagnosis not present

## 2020-06-12 DIAGNOSIS — M545 Low back pain, unspecified: Secondary | ICD-10-CM

## 2020-06-12 DIAGNOSIS — G8929 Other chronic pain: Secondary | ICD-10-CM

## 2020-06-12 NOTE — Patient Instructions (Addendum)
A few things to remember from today's visit:   Mass in chest  Chronic low back pain, unspecified back pain laterality, unspecified whether sciatica present - Plan: Ambulatory referral to Orthopedic Surgery  Smoking cessation strongly recommended,please let me know if you need medication to help.  [facility] [phone], please call to find out status of order of chest CT. Please call surgeon office after having it done.  Ortho referral placed for the back.  Please be sure medication list is accurate. If a new problem present, please set up appointment sooner than planned today.

## 2020-06-12 NOTE — Progress Notes (Signed)
Chief Complaint  Patient presents with  . Follow-up   HPI: [name] is a 39 y.o. male, who is here today to follow on prior visits. Right-sided chest wall tender lesion.  He was seen initially for this problem on 01/19/20, when he was c/o right rib cage pain. He noted lesion a few months ago. Size has been stable in general, sometimes it seems smaller. No fever,abnormal wt loss, night sweats,fever,cough,wheezing, local drainage,erythema,or skin changes of chest wall.  Rib X ray on 01/19/20 was negative radiographs of the right ribs. No explanation for tender nodule. Referred to surgery in 02/2020, he cancelled appt. Chest wall soft tissue US on 04/25/20:Benign-appearing cystic lesion of the right chest wall. No solid lesion is present. Recommend CT of the chest with contrast for further evaluation. This most likely represents a benign veno-lymphatic lesion.  Chest CT was ordered on 05/01/20.Marland Kitchen He has not received information about appt.  He is c/o lower back pain radiated to LE's with numbness and tingling.Exacerbated by activities like running or prolonged walking. Alleviated by rest and stretching. He has not noted rash,edema,or erythema on affected area. Negative for abdominal pain, N/V,changes in bowel habits,or urinary symptoms.  We discussed this problem in 08/2019. Negative for saddle anesthesia,weakness,or bladder/bowel dysfunction.  He has had problem for about 4 years. Lumbar MRI done on 04/24/20:Mild degenerative disc disease at L5-S1. No significant spinal canal or neural foraminal stenosis at any level.  Problem seems to be getting worse.  He is concerned because his 15 yo brother just died from MI.  + Tobacco use. He has not noted unusual/severe headache,CP,palpitations,SOB,or diaphoresis with exertion. No hx of HLD,HTN,CAD,or diabetes. Lab Results  Component Value Date   CHOL 139 10/05/2019   HDL 47.80 10/05/2019   LDLCALC 77 10/05/2019   TRIG 71.0  10/05/2019   CHOLHDL 3 10/05/2019   Lab Results  Component Value Date   HGBA1C 5.9 10/05/2019   Review of Systems  Constitutional: Negative for activity change, appetite change and fatigue.  HENT: Negative for mouth sores, nosebleeds and sore throat.   Cardiovascular: Negative for leg swelling.  Genitourinary: Negative for decreased urine volume, dysuria and hematuria.  Musculoskeletal: Positive for back pain. Negative for gait problem.  Skin: Negative for pallor and rash.  Neurological: Negative for syncope and weakness.  Psychiatric/Behavioral: Negative for confusion. The patient is nervous/anxious.   Rest see pertinent positives and negatives per HPI.  Current Outpatient Medications on File Prior to Visit  Medication Sig Dispense Refill  . clotrimazole-betamethasone (LOTRISONE) cream Apply 1 application topically 2 (two) times daily as needed. 45 g 1   No current facility-administered medications on file prior to visit.   Past Medical History:  Diagnosis Date  . Tobacco abuse disorder    No Known Allergies  Social History   Socioeconomic History  . Marital status: Married    Spouse name: Not on file  . Number of children: Not on file  . Years of education: Not on file  . Highest education level: Not on file  Occupational History  . Not on file  Tobacco Use  . Smoking status: Current Every Day Smoker  . Smokeless tobacco: Never Used  Substance and Sexual Activity  . Alcohol use: Yes  . Drug use: Yes    Types: Marijuana  . Sexual activity: Not on file  Other Topics Concern  . Not on file  Social History Narrative  . Not on file   Social Determinants of Health  Financial Resource Strain:   . Difficulty of Paying Living Expenses: Not on file  Food Insecurity:   . Worried About Programme researcher, broadcasting/film/video in the Last Year: Not on file  . Ran Out of Food in the Last Year: Not on file  Transportation Needs:   . Lack of Transportation (Medical): Not on file  . Lack  of Transportation (Non-Medical): Not on file  Physical Activity:   . Days of Exercise per Week: Not on file  . Minutes of Exercise per Session: Not on file  Stress:   . Feeling of Stress : Not on file  Social Connections:   . Frequency of Communication with Friends and Family: Not on file  . Frequency of Social Gatherings with Friends and Family: Not on file  . Attends Religious Services: Not on file  . Active Member of Clubs or Organizations: Not on file  . Attends Banker Meetings: Not on file  . Marital Status: Not on file    Vitals:   06/12/20 1124  BP: 122/80  Pulse: 84  Resp: 16  Temp: 98.7 F (37.1 C)  SpO2: 95%   Body mass index is 24.62 kg/m.  Physical Exam Vitals and nursing note reviewed.  Constitutional:      General: He is not in acute distress.    Appearance: He is well-developed and normal weight. He is not ill-appearing.  HENT:     Head: Normocephalic and atraumatic.  Eyes:     Conjunctiva/sclera: Conjunctivae normal.  Cardiovascular:     Rate and Rhythm: Normal rate and regular rhythm.     Heart sounds: No murmur heard.   Pulmonary:     Effort: Pulmonary effort is normal. No respiratory distress.     Breath sounds: Normal breath sounds.  Chest:     Chest wall: Tenderness present.    Abdominal:     Palpations: Abdomen is soft. There is no hepatomegaly or mass.     Tenderness: There is no abdominal tenderness.  Musculoskeletal:     Lumbar back: No tenderness or bony tenderness. Negative right straight leg raise test and negative left straight leg raise test.     Comments: No local edema or erythema appreciated, no suspicious lesions.   Lymphadenopathy:     Cervical: No cervical adenopathy.  Skin:    General: Skin is warm.     Findings: No erythema.  Neurological:     Mental Status: He is alert and oriented to person, place, and time.     Coordination: Coordination normal.     Gait: Gait normal.     Deep Tendon Reflexes:      Reflex Scores:      Patellar reflexes are 2+ on the right side and 2+ on the left side. Psychiatric:        Mood and Affect: Mood is anxious.     Comments: Well groomed,good eye contact.    ASSESSMENT AND PLAN:  Edward Blackburn was seen today for follow-up.  Diagnoses and all orders for this visit:  Chronic low back pain, unspecified back pain laterality, unspecified whether sciatica present Lumbar MRI done in 04/2020 showed some mild degenerative changes that could be causing problem. Ortho appt will be arranged.  -     Ambulatory referral to Orthopedic Surgery  Mass in chest Seems to be a cystic lesion, most likely benign. He was instructed to re-schedule appt with surgeon. Pending chest CT, appt has not been arranged but request has been sent. Phone number  given,so he can call to inquire about status. Instructed about warning signs. Instructed to let us know if he has not had chest CT in 2 weeks.  Tobacco use disorder We discussed adverse effects and benefits of smoking cessation. He is not interested in pharmacologic treatment. He is not ready to quit now. He will let me know if he wants pharmacologic treatment, Chantix or nicotine patches.   Return if symptoms worsen or fail to improve.   Tonesha Tsou G. [place], MD  [facility]. [place] office.  A few things to remember from today's visit:   Mass in chest  Chronic low back pain, unspecified back pain laterality, unspecified whether sciatica present - Plan: Ambulatory referral to Orthopedic Surgery  Smoking cessation strongly recommended,please let me know if you need medication to help.  [facility] [phone], please call to find out status of order of chest CT. Please call surgeon office after having it done.  Ortho referral placed for the back.  Please be sure medication list is accurate. If a new problem present, please set up appointment sooner than planned today.

## 2020-06-17 ENCOUNTER — Encounter: Payer: Self-pay | Admitting: Family Medicine

## 2020-06-21 ENCOUNTER — Other Ambulatory Visit: Payer: Self-pay

## 2020-06-21 ENCOUNTER — Ambulatory Visit (INDEPENDENT_AMBULATORY_CARE_PROVIDER_SITE_OTHER): Payer: BC Managed Care – PPO | Admitting: Orthopaedic Surgery

## 2020-06-21 ENCOUNTER — Encounter: Payer: Self-pay | Admitting: Orthopaedic Surgery

## 2020-06-21 VITALS — Ht 72.0 in | Wt 186.0 lb

## 2020-06-21 DIAGNOSIS — G8929 Other chronic pain: Secondary | ICD-10-CM | POA: Diagnosis not present

## 2020-06-21 DIAGNOSIS — M545 Low back pain, unspecified: Secondary | ICD-10-CM | POA: Diagnosis not present

## 2020-06-21 NOTE — Progress Notes (Signed)
Office Visit Note   Patient: Edward Magri Sr.           Date of Birth: 29-Oct-1980           MRN: [id number] Visit Date: 06/21/2020              Requested by: [place], Betty G, MD [address] [place],  [place] [address] PCP: [place], Betty G, MD   Assessment & Plan: Visit Diagnoses:  1. Chronic low back pain, unspecified back pain laterality, unspecified whether sciatica present     Plan: We discussed single level disc degeneration is likely having some intermittent bulging but has no evidence of radiculopathy on exam.  If he develops progressive symptoms we discussed checking heel and toe walking so he notes the signs and symptoms to look for if his pathophysiology progresses.  We discussed smoking cessation staying in good shape with good core strengthening.  He can return if he develops progressive symptoms.  MRI scan and radiographs were reviewed and discussed.  Follow-Up Instructions: Return if symptoms worsen or fail to improve.   Orders:  No orders of the defined types were placed in this encounter.  No orders of the defined types were placed in this encounter.     Procedures: No procedures performed   Clinical Data: No additional findings.   Subjective: Chief Complaint  Patient presents with  . Lower Back - Pain    HPI 39 year old male with a greater than 2-year history of low back pain and buttock pain.  Patient states if he sits for long gets up he has some numbness and tingling in his thighs and aching pain.  After he moves around he feels better.  He has had an MRI scan done 04/24/2020 which showed mild disc degeneration with dehydration and small disc protrusion indenting the thecal sac without significant central lateral recess or neural foraminal stenosis.  All other levels above were normal with good hydration no compression.  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  He denies associated bowel or bladder symptoms.  He taken  prednisone which may be gave him some temporary relief.  Review of Systems negative for gout no bowel bladder symptoms no fever chills or fever all other systems are reviewed and are negative.   Objective: Vital Signs: Ht 6' (1.829 m)   Wt 186 lb (84.4 kg)   BMI 25.23 kg/m   Physical Exam Constitutional:      Appearance: He is well-developed.  HENT:     Head: Normocephalic and atraumatic.  Eyes:     Pupils: Pupils are equal, round, and reactive to light.  Neck:     Thyroid: No thyromegaly.     Trachea: No tracheal deviation.  Cardiovascular:     Rate and Rhythm: Normal rate.  Pulmonary:     Effort: Pulmonary effort is normal.     Breath sounds: No wheezing.  Abdominal:     General: Bowel sounds are normal.     Palpations: Abdomen is soft.  Skin:    General: Skin is warm and dry.     Capillary Refill: Capillary refill takes less than 2 seconds.  Neurological:     Mental Status: He is alert and oriented to person, place, and time.  Psychiatric:        Behavior: Behavior normal.        Thought Content: Thought content normal.        Judgment: Judgment normal.  Ortho Exam mild sciatic notch tenderness right and left negative logroll the hips negative popliteal compression test knee and ankle jerk are intact he is able to heel and toe walk.  He has some discomfort with forward flexion and extension lumbar spine.  Mild tenderness at the lumbosacral junction at midline.  Trochanters are normal.  No hamstring gastrocsoleus weakness or atrophy. Specialty Comments:  No specialty comments available.  Imaging: CLINICAL DATA:  Lumbar radiculopathy.  EXAM: MRI LUMBAR SPINE WITHOUT CONTRAST  TECHNIQUE: Multiplanar, multisequence MR imaging of the lumbar spine was performed. No intravenous contrast was administered.  COMPARISON:  None.  FINDINGS: Segmentation:  Standard.  Alignment:  Physiologic.  Vertebrae:  No fracture, evidence of discitis, or bone  lesion.  Conus medullaris and cauda equina: Conus extends to the L1-2 level. Conus and cauda equina appear normal.  Paraspinal and other soft tissues: Negative.  Disc levels:  T12-L1: No spinal canal or neural foraminal stenosis.  L1-2: No spinal canal or neural foraminal stenosis.  L2-3: No spinal canal or neural foraminal stenosis.  L3-4: No spinal canal or neural foraminal stenosis.  L4-5: No spinal canal or neural foraminal stenosis.  L5-S1: Mild loss of disc height, tiny posterior disc protrusion causing small indentation on the thecal sac without significant spinal canal or neural foraminal stenosis. Mild facet degenerative changes.  IMPRESSION: Mild degenerative disc disease at L5-S1. No significant spinal canal or neural foraminal stenosis at any level.   Electronically Signed   By: Baldemar Lenis M.D.   On: 04/24/2020 12:11       PMFS History: Patient Active Problem List   Diagnosis Date Noted  . Numbness and tingling of both lower extremities 08/24/2019  . Chronic low back pain 08/24/2019  . Headache, unspecified headache type 08/24/2019   Past Medical History:  Diagnosis Date  . Tobacco abuse disorder     Family History  Problem Relation Age of Onset  . Heart disease Daughter     No past surgical history on file. Social History   Occupational History  . Not on file  Tobacco Use  . Smoking status: Current Every Day Smoker  . Smokeless tobacco: Never Used  Substance and Sexual Activity  . Alcohol use: Yes  . Drug use: Yes    Types: Marijuana  . Sexual activity: Not on file

## 2020-07-01 ENCOUNTER — Emergency Department (HOSPITAL_COMMUNITY)
Admission: EM | Admit: 2020-07-01 | Discharge: 2020-07-01 | Disposition: A | Discharge status: Home / Self Care | Payer: BC Managed Care – PPO | Attending: Emergency Medicine | Admitting: Emergency Medicine

## 2020-07-01 ENCOUNTER — Other Ambulatory Visit: Payer: Self-pay

## 2020-07-01 ENCOUNTER — Emergency Department (HOSPITAL_COMMUNITY): Payer: BC Managed Care – PPO

## 2020-07-01 DIAGNOSIS — Z5321 Procedure and treatment not carried out due to patient leaving prior to being seen by health care provider: Secondary | ICD-10-CM | POA: Insufficient documentation

## 2020-07-01 DIAGNOSIS — R519 Headache, unspecified: Secondary | ICD-10-CM | POA: Insufficient documentation

## 2020-07-01 DIAGNOSIS — R079 Chest pain, unspecified: Secondary | ICD-10-CM | POA: Diagnosis not present

## 2020-07-01 DIAGNOSIS — R0602 Shortness of breath: Secondary | ICD-10-CM | POA: Insufficient documentation

## 2020-07-01 DIAGNOSIS — R059 Cough, unspecified: Secondary | ICD-10-CM | POA: Diagnosis not present

## 2020-07-01 DIAGNOSIS — J90 Pleural effusion, not elsewhere classified: Secondary | ICD-10-CM | POA: Diagnosis not present

## 2020-07-01 LAB — CBC
HCT: 43 % (ref 39.0–52.0)
Hemoglobin: 13.4 g/dL (ref 13.0–17.0)
MCH: 26 pg (ref 26.0–34.0)
MCHC: 31.2 g/dL (ref 30.0–36.0)
MCV: 83.3 fL (ref 80.0–100.0)
Platelets: 326 10*3/uL (ref 150–400)
RBC: 5.16 MIL/uL (ref 4.22–5.81)
RDW: 13.1 % (ref 11.5–15.5)
WBC: 4.7 10*3/uL (ref 4.0–10.5)
nRBC: 0 % (ref 0.0–0.2)

## 2020-07-01 LAB — BASIC METABOLIC PANEL
Anion gap: 10 (ref 5–15)
BUN: 7 mg/dL (ref 6–20)
CO2: 25 mmol/L (ref 22–32)
Calcium: 10 mg/dL (ref 8.9–10.3)
Chloride: 103 mmol/L (ref 98–111)
Creatinine, Ser: 0.9 mg/dL (ref 0.61–1.24)
GFR, Estimated: >60 mL/min (ref >60)
Glucose, Bld: 107 mg/dL — ABNORMAL HIGH (ref 70–99)
Potassium: 3.9 mmol/L (ref 3.5–5.1)
Sodium: 138 mmol/L (ref 135–145)

## 2020-07-01 LAB — TROPONIN I (HIGH SENSITIVITY): Troponin I (High Sensitivity): 5 ng/L (ref <18)

## 2020-07-01 NOTE — ED Notes (Signed)
Called pt for vitals no answer 

## 2020-07-01 NOTE — ED Triage Notes (Signed)
Pt here for eval of central chest pain, headache with ear fullness, and shob while on the way to lunch today. Symptoms have improved slightly since this event to time of triage.

## 2020-11-17 ENCOUNTER — Other Ambulatory Visit: Payer: Self-pay

## 2020-11-17 ENCOUNTER — Other Ambulatory Visit (HOSPITAL_COMMUNITY)
Admission: RE | Admit: 2020-11-17 | Discharge: 2020-11-17 | Disposition: A | Discharge status: Home / Self Care | Payer: No Typology Code available for payment source | Source: Ambulatory Visit | Attending: Family Medicine | Admitting: Family Medicine

## 2020-11-17 ENCOUNTER — Encounter: Payer: Self-pay | Admitting: Family Medicine

## 2020-11-17 ENCOUNTER — Ambulatory Visit (INDEPENDENT_AMBULATORY_CARE_PROVIDER_SITE_OTHER): Payer: No Typology Code available for payment source | Admitting: Family Medicine

## 2020-11-17 VITALS — BP 116/80 | HR 82 | Temp 98.5°F | Resp 16 | Ht 72.0 in | Wt 186.6 lb

## 2020-11-17 DIAGNOSIS — G4730 Sleep apnea, unspecified: Secondary | ICD-10-CM

## 2020-11-17 DIAGNOSIS — Z113 Encounter for screening for infections with a predominantly sexual mode of transmission: Secondary | ICD-10-CM | POA: Insufficient documentation

## 2020-11-17 DIAGNOSIS — R7303 Prediabetes: Secondary | ICD-10-CM | POA: Diagnosis not present

## 2020-11-17 DIAGNOSIS — R0782 Intercostal pain: Secondary | ICD-10-CM

## 2020-11-17 DIAGNOSIS — Z1159 Encounter for screening for other viral diseases: Secondary | ICD-10-CM

## 2020-11-17 DIAGNOSIS — R222 Localized swelling, mass and lump, trunk: Secondary | ICD-10-CM

## 2020-11-17 LAB — HEMOGLOBIN A1C: Hgb A1c MFr Bld: 6 % (ref 4.6–6.5)

## 2020-11-17 NOTE — Patient Instructions (Addendum)
A few things to remember from today's visit:   Chest wall mass - Plan: CT Chest W Contrast  Intercostal pain - Plan: CT Chest W Contrast  Screen for STD (sexually transmitted disease) - Plan: RPR, HIV Antibody (routine testing w rflx), Urine cytology ancillary only  Prediabetes - Plan: Hemoglobin A1c  Sleep apnea, unspecified type - Plan: Ambulatory referral to Sleep Studies   Preventing Sexually Transmitted Infections, Adult Sexually transmitted infections (STIs) are diseases that are spread from person to person (are contagious). They are spread, or transmitted, through bodily fluids exchanged during sex or sexual contact. These bodily fluids include saliva, semen, blood, vaginal mucus, and urine. STIs are very common among people of all ages. Some common STIs include:  Herpes.  Hepatitis B.  Chlamydia.  Gonorrhea.  Syphilis.  HPV (human papillomavirus).  HIV, also called the human immunodeficiency virus. This is the virus that can cause AIDS (acquired immunodeficiency syndrome). Often, people who have these STIs do not have symptoms. Even without symptoms, these infections can be spread from person to person and require treatment. How can these conditions affect me? STIs can be treated, and many STIs can be cured. However, some STIs cannot be cured and will affect you for the rest of your life. Certain STIs may:  Require you to take medicine for the rest of your life.  Affect your ability to have children (your fertility).  Increase your risk for developing another STI or certain serious health conditions. These may include: ? Cervical cancer. ? Head and neck cancer. ? Pelvic inflammatory disease (PID), in women. ? Organ damage or damage to other parts of your body, if the infection spreads.  Cause problems during pregnancy and may be transmitted to the baby during the pregnancy or childbirth. What can increase my risk? You may have an increased risk for developing  an STI if:  You have unprotected sex. Sex includes oral, vaginal, or anal sex.  You have more than one sex partner.  You have a sex partner who has multiple sex partners.  You have sex with someone who has an STI.  You have an STI or you had an STI before.  You inject drugs or have a sex partner or partners who inject drugs. What actions can I take to prevent STIs? The only way to completely prevent STIs is not to have sex of any kind. This is called practicing abstinence. If you are sexually active, you can protect yourself and others by taking these actions to lower your risk of getting an STI: Lifestyle Avoid mixing alcohol, drugs, and sex. Alcohol and drug use can affect your ability to make good decisions and can lead to risky sexual behaviors. Medicines Ask your health care provider about taking pre-exposure prophylaxis (PrEP) to prevent HIV infection. General information  Stay up to date on vaccinations. Certain vaccines can lower your risk of getting certain STIs, such as: ? Hepatitis A and hepatitis B vaccines. You may have been vaccinated as a young child, but you will likely need a booster shot as a teen or young adult. ? HPV (human papillomavirus) vaccine.  Have only one sex partner (be monogamous) or limit the number of sex partners you have.  Use methods that prevent the exchange of body fluids between partners (barrier protection) correctly every time you have sex. Barrier protection can be used during oral, vaginal, or anal sex. Commonly used barrier methods include: ? Male condom. ? Male condom. ? Dental dam.  Use a  new condom for every sex act from start to finish.  Get tested for STIs. Have your partners get tested, too.  If you test positive for an STI, follow recommendations from your health care provider about treatment and make sure your sex partners are tested and treated.  Birth control pills, injections, implants, and intrauterine devices (IUDs) do  not protect against STIs. To prevent both STIs and pregnancy, always use a condom with another form of birth control.  Some STIs, such as herpes, are spread through skin-to-skin contact. A condom may not protect you from getting such STIs. Avoid all sexual contact if you or your partners have herpes and there is an active flare with open sores.   Where to find more information Learn more about STIs from:  Centers for Disease Control and Prevention: ? More information about specific STIs: BloggingList.ca ? Places to get sexual health counseling and treatment for free or at a low cost: gettested.TonerPromos.no  U.S. Department of Health and Human Services: http://hoffman.com/ Summary  Sexually transmitted infections (STIs) can spread through exchanging bodily fluids during sexual contact. Fluids include saliva, semen, blood, vaginal mucus, and urine.  You may have an increased risk for developing an STI if you have unprotected sex. Sex includes oral, vaginal, or anal sex.  If you do have sex, limit your number of sex partners and use barrier protection every time you have sex. This information is not intended to replace advice given to you by your health care provider. Make sure you discuss any questions you have with your health care provider. Document Revised: 08/23/2019 Document Reviewed: 08/23/2019 Elsevier Patient Education  2021 Elsevier Inc.  Please be sure medication list is accurate. If a new problem present, please set up appointment sooner than planned today.

## 2020-11-17 NOTE — Progress Notes (Signed)
HPI: [name] is a 40 y.o. male, who is here today with some concerns.   He was last seen on 06/12/20 Concerned about possible OSA. Witnessed sleep apnea, he has been told a few times for the past year. He does not feel rested when he gets up in the morning. Occasionally morning headache. He has waken up himself feeling like he is" choking."  Since his last visit he has seen surgeon in regard to chest wall cyst. Still having chest wall pain "sometimes", no changes in size. Negative for cough,wheezing,SOB,skin erythema or skin texture changes on affected area.  He met with surgeon in 05/2020 and states that he was supposed to have a chest CT arranged. He would like to have lesion removed. Negative for fever,night sweats,abnormal wt loss,cough,wheezing,or SOB. + Smokr.  He would like to have STD screening. He is not aware of exposure and is not having symptoms that suggest STD. Negative for urethral discharge,genital lesions,dysuria,or skin rash.  Currently he has 2 sex partners. He does not wear condoms regularly.  Glucose has been elevated a few times. No hx of DM II. Negative for abdominal pain, nausea,vomiting, polydipsia,polyuria, or polyphagia.  Lab Results  Component Value Date   HGBA1C 5.9 10/05/2019   Review of Systems  Constitutional: Positive for fatigue. Negative for activity change and appetite change.  HENT: Negative for mouth sores, nosebleeds and sore throat.   Eyes: Negative for redness and visual disturbance.  Cardiovascular: Negative for palpitations and leg swelling.  Genitourinary: Negative for decreased urine volume, hematuria, penile swelling and testicular pain.  Neurological: Negative for syncope and weakness.  Hematological: Negative for adenopathy. Does not bruise/bleed easily.  Rest of ROS, see pertinent positives sand negatives in HPI  Current Outpatient Medications on File Prior to Visit  Medication Sig Dispense Refill  .  clotrimazole-betamethasone (LOTRISONE) cream Apply 1 application topically 2 (two) times daily as needed. 45 g 1   No current facility-administered medications on file prior to visit.   Past Medical History:  Diagnosis Date  . Tobacco abuse disorder    No Known Allergies  Social History   Socioeconomic History  . Marital status: Married    Spouse name: Not on file  . Number of children: Not on file  . Years of education: Not on file  . Highest education level: Not on file  Occupational History  . Not on file  Tobacco Use  . Smoking status: Current Every Day Smoker  . Smokeless tobacco: Never Used  Substance and Sexual Activity  . Alcohol use: Yes  . Drug use: Yes    Types: Marijuana  . Sexual activity: Not on file  Other Topics Concern  . Not on file  Social History Narrative  . Not on file   Social Determinants of Health   Financial Resource Strain: Not on file  Food Insecurity: Not on file  Transportation Needs: Not on file  Physical Activity: Not on file  Stress: Not on file  Social Connections: Not on file   Vitals:   11/17/20 0937  BP: 116/80  Pulse: 82  Resp: 16  Temp: 98.5 F (36.9 C)  SpO2: 96%   Body mass index is 25.31 kg/m.   Physical Exam Vitals and nursing note reviewed.  Constitutional:      General: He is not in acute distress.    Appearance: He is well-developed.  HENT:     Head: Normocephalic and atraumatic.     Mouth/Throat:  Mouth: Mucous membranes are moist.     Pharynx: Oropharynx is clear.  Eyes:     Conjunctiva/sclera: Conjunctivae normal.  Cardiovascular:     Rate and Rhythm: Normal rate and regular rhythm.     Heart sounds: No murmur heard.   Pulmonary:     Effort: Pulmonary effort is normal. No respiratory distress.     Breath sounds: Normal breath sounds.  Chest:     Chest wall: No crepitus or edema.    Abdominal:     Palpations: Abdomen is soft. There is no hepatomegaly or mass.     Tenderness: There is no  abdominal tenderness.  Lymphadenopathy:     Cervical: No cervical adenopathy.  Skin:    General: Skin is warm.     Findings: No erythema or rash.  Neurological:     Mental Status: He is alert and oriented to person, place, and time.     Cranial Nerves: No cranial nerve deficit.     Gait: Gait normal.  Psychiatric:     Comments: Well groomed, good eye contact.   ASSESSMENT AND PLAN: [name] was seen today for follow-up.  Orders Placed This Encounter  Procedures  . CT Chest W Contrast  . RPR  . Hemoglobin A1c  . HIV Antibody (routine testing w rflx)  . Hepatitis C antibody  . Ambulatory referral to Sleep Studies   Lab Results  Component Value Date   HGBA1C 6.0 11/17/2020   Chest wall mass He has already seen surgeon, he needs to arrange a f/u appt. He would like to have lesion removed. Chest CT will be arranged. I Screen for STD (sexually transmitted disease) STD prevention discussed. Condom use and safe sex practices encouraged.  Prediabetes Healthy life style recommended for diabetes prevention.  Sleep apnea, unspecified type Witnessed episodes of apnea. Sleep study will be arranged.  Encounter for HCV screening test for low risk patient -     Hepatitis C antibody  Return in about 1 year (around 11/17/2021) for cpe.   Terika Pillard G. [place], MD  [facility]. [place] office.   A few things to remember from today's visit:   Chest wall mass - Plan: CT Chest W Contrast  Intercostal pain - Plan: CT Chest W Contrast  Screen for STD (sexually transmitted disease) - Plan: RPR, HIV Antibody (routine testing w rflx), Urine cytology ancillary only  Prediabetes - Plan: Hemoglobin A1c  Sleep apnea, unspecified type - Plan: Ambulatory referral to Sleep Studies   Preventing Sexually Transmitted Infections, Adult Sexually transmitted infections (STIs) are diseases that are spread from person to person (are contagious). They are spread, or  transmitted, through bodily fluids exchanged during sex or sexual contact. These bodily fluids include saliva, semen, blood, vaginal mucus, and urine. STIs are very common among people of all ages. Some common STIs include:  Herpes.  Hepatitis B.  Chlamydia.  Gonorrhea.  Syphilis.  HPV (human papillomavirus).  HIV, also called the human immunodeficiency virus. This is the virus that can cause AIDS (acquired immunodeficiency syndrome). Often, people who have these STIs do not have symptoms. Even without symptoms, these infections can be spread from person to person and require treatment. How can these conditions affect me? STIs can be treated, and many STIs can be cured. However, some STIs cannot be cured and will affect you for the rest of your life. Certain STIs may:  Require you to take medicine for the rest of your life.  Affect your ability  to have children (your fertility).  Increase your risk for developing another STI or certain serious health conditions. These may include: ? Cervical cancer. ? Head and neck cancer. ? Pelvic inflammatory disease (PID), in women. ? Organ damage or damage to other parts of your body, if the infection spreads.  Cause problems during pregnancy and may be transmitted to the baby during the pregnancy or childbirth. What can increase my risk? You may have an increased risk for developing an STI if:  You have unprotected sex. Sex includes oral, vaginal, or anal sex.  You have more than one sex partner.  You have a sex partner who has multiple sex partners.  You have sex with someone who has an STI.  You have an STI or you had an STI before.  You inject drugs or have a sex partner or partners who inject drugs. What actions can I take to prevent STIs? The only way to completely prevent STIs is not to have sex of any kind. This is called practicing abstinence. If you are sexually active, you can protect yourself and others by taking these  actions to lower your risk of getting an STI: Lifestyle Avoid mixing alcohol, drugs, and sex. Alcohol and drug use can affect your ability to make good decisions and can lead to risky sexual behaviors. Medicines Ask your health care provider about taking pre-exposure prophylaxis (PrEP) to prevent HIV infection. General information  Stay up to date on vaccinations. Certain vaccines can lower your risk of getting certain STIs, such as: ? Hepatitis A and hepatitis B vaccines. You may have been vaccinated as a young child, but you will likely need a booster shot as a teen or young adult. ? HPV (human papillomavirus) vaccine.  Have only one sex partner (be monogamous) or limit the number of sex partners you have.  Use methods that prevent the exchange of body fluids between partners (barrier protection) correctly every time you have sex. Barrier protection can be used during oral, vaginal, or anal sex. Commonly used barrier methods include: ? Male condom. ? Male condom. ? Dental dam.  Use a new condom for every sex act from start to finish.  Get tested for STIs. Have your partners get tested, too.  If you test positive for an STI, follow recommendations from your health care provider about treatment and make sure your sex partners are tested and treated.  Birth control pills, injections, implants, and intrauterine devices (IUDs) do not protect against STIs. To prevent both STIs and pregnancy, always use a condom with another form of birth control.  Some STIs, such as herpes, are spread through skin-to-skin contact. A condom may not protect you from getting such STIs. Avoid all sexual contact if you or your partners have herpes and there is an active flare with open sores.   Where to find more information Learn more about STIs from:  Centers for Disease Control and Prevention: ? More information about specific STIs: https://price.info/ ? Places to get sexual health counseling and treatment for  free or at a low cost: gettested.StoreMirror.com.cy  U.S. Department of Health and Human Services: [place]BeachSigns.tn Summary  Sexually transmitted infections (STIs) can spread through exchanging bodily fluids during sexual contact. Fluids include saliva, semen, blood, vaginal mucus, and urine.  You may have an increased risk for developing an STI if you have unprotected sex. Sex includes oral, vaginal, or anal sex.  If you do have sex, limit your number of sex partners and use barrier protection  every time you have sex. This information is not intended to replace advice given to you by your health care provider. Make sure you discuss any questions you have with your health care provider. Document Revised: 08/23/2019 Document Reviewed: 08/23/2019 Elsevier Patient Education  2021 [place].  Please be sure medication list is accurate. If a new problem present, please set up appointment sooner than planned today.

## 2020-11-20 ENCOUNTER — Encounter: Payer: Self-pay | Admitting: Family Medicine

## 2020-11-20 LAB — URINE CYTOLOGY ANCILLARY ONLY
Chlamydia: NEGATIVE
Comment: NEGATIVE
Comment: NEGATIVE
Comment: NORMAL
Neisseria Gonorrhea: NEGATIVE
Trichomonas: NEGATIVE

## 2020-11-20 LAB — HEPATITIS C ANTIBODY
Hepatitis C Ab: NONREACTIVE
SIGNAL TO CUT-OFF: 0.02 (ref <1.00)

## 2020-11-20 LAB — HIV ANTIBODY (ROUTINE TESTING W REFLEX): HIV 1&2 Ab, 4th Generation: NONREACTIVE

## 2020-11-20 LAB — RPR: RPR Ser Ql: NONREACTIVE

## 2020-11-21 ENCOUNTER — Inpatient Hospital Stay: Admission: RE | Admit: 2020-11-21 | Payer: No Typology Code available for payment source | Source: Ambulatory Visit

## 2021-04-06 IMAGING — MR MR LUMBAR SPINE W/O CM
4 of 5 series · 27 of 48 positions shown · non-contrast
Comparison: None.

CLINICAL DATA: Lumbar radiculopathy.

EXAM:
MRI LUMBAR SPINE WITHOUT CONTRAST
TECHNIQUE: Multiplanar, multisequence MR imaging of the lumbar spine was
performed. No intravenous contrast was administered.

[Series 2: T2 · sagittal · 4.0mm · 1.09mm/px · 6 of 14 slices shown (1 of 2)]
[im 1/14]
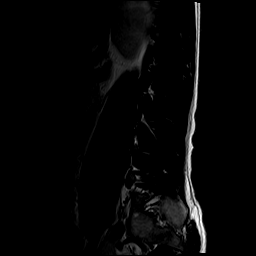
[im 3/14]
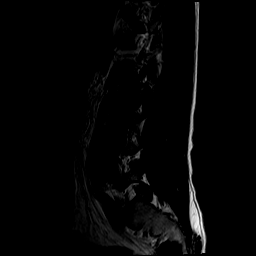
[im 6/14]
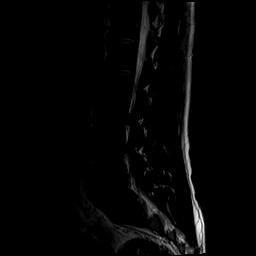
[im 8/14]
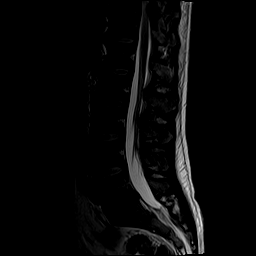
[im 11/14]
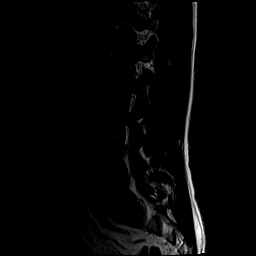
[im 14/14]
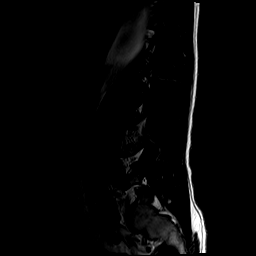

[Series 4: T1 · sagittal · 4.0mm · 1.09mm/px · 6 of 17 slices shown (1 of 2)]
[im 1/17]
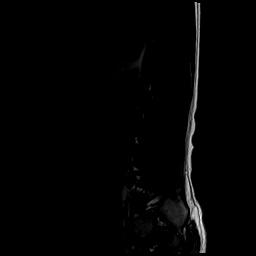
[im 4/17]
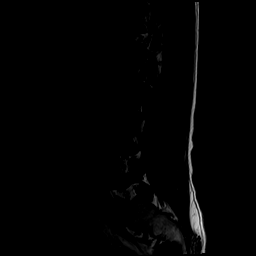
[im 7/17]
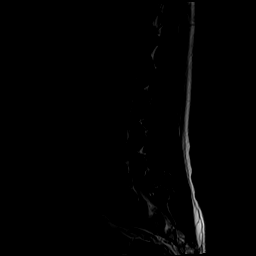
[im 10/17]
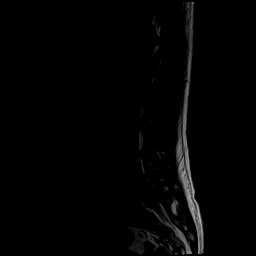
[im 13/17]
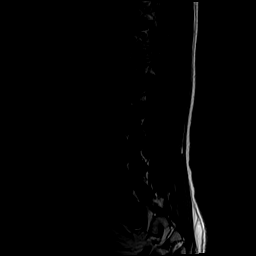
[im 17/17]
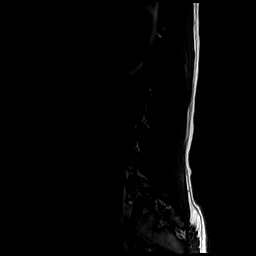

[Series 5: T2 · axial · 4.0mm · 0.39mm/px · z∈[-29,+195]mm · 9 of 43 slices shown (2 of 2)]
[im 1/43]
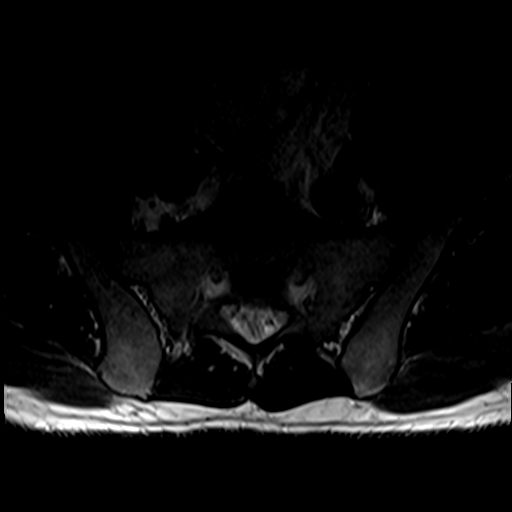
[im 7/43]
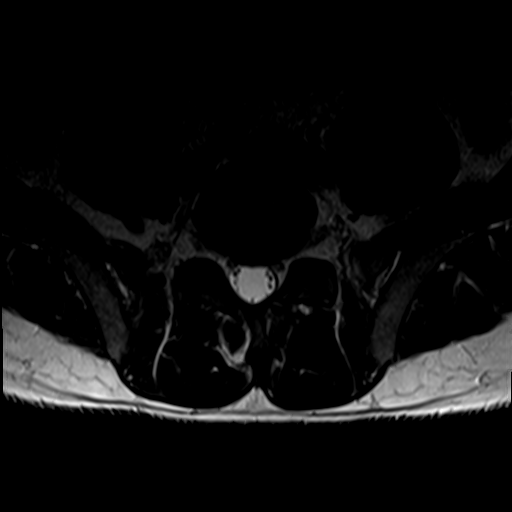
[im 13/43]
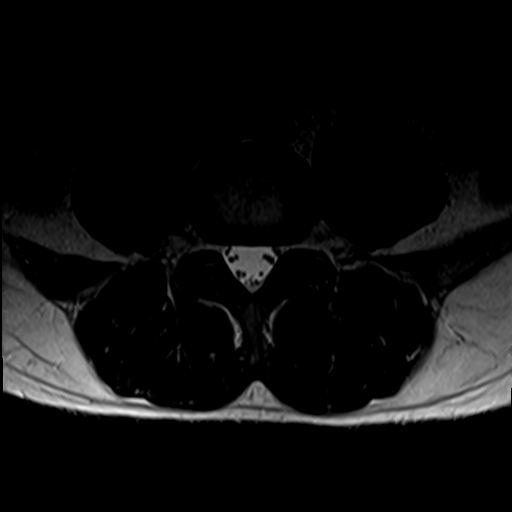
[im 19/43]
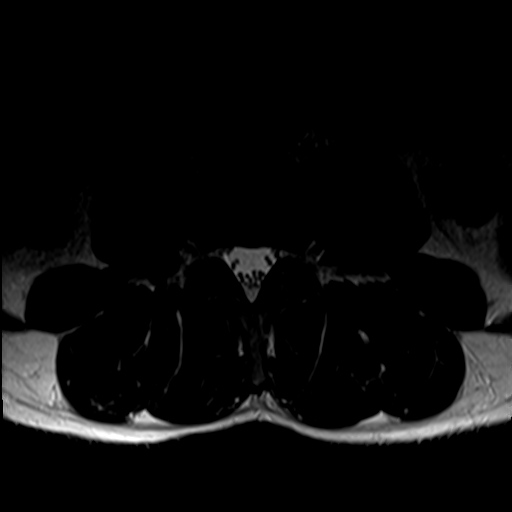
[im 22/43]
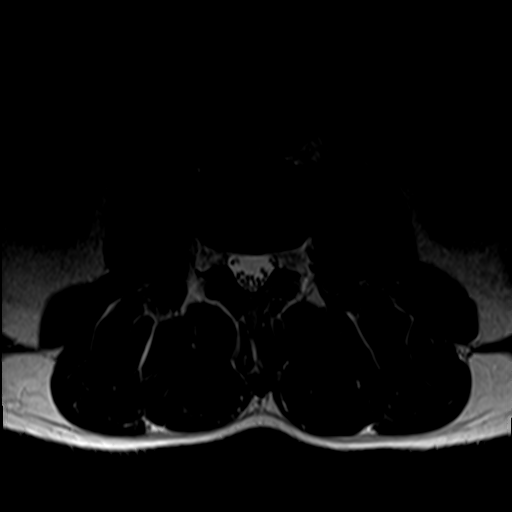
[im 25/43]
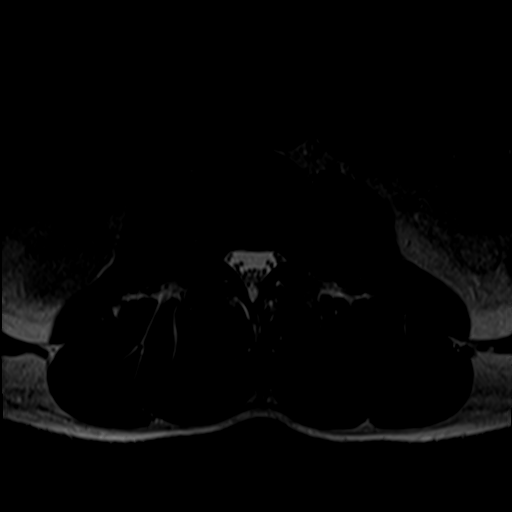
[im 31/43]
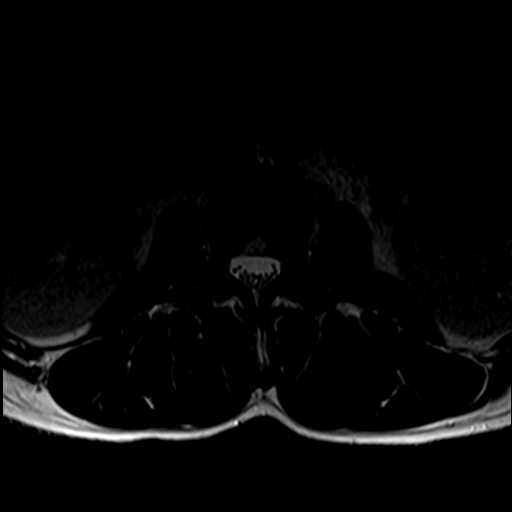
[im 37/43]
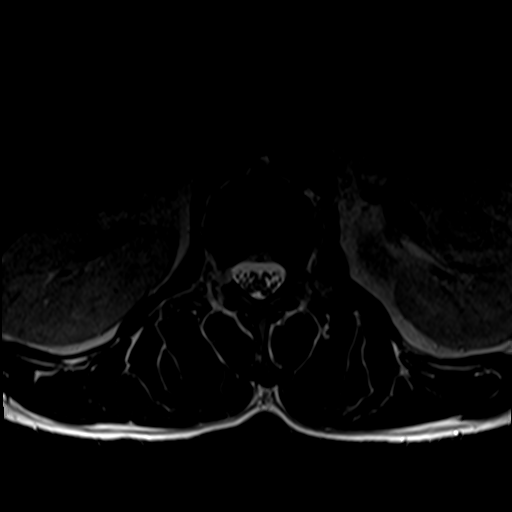
[im 43/43]
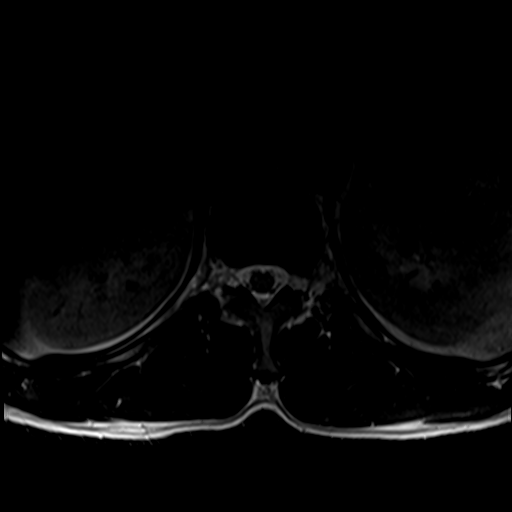

[Series 6: T1 · axial · 4.0mm · 0.39mm/px · z∈[-29,+166]mm · 6 of 43 slices shown (2 of 2)]
[im 1/43]
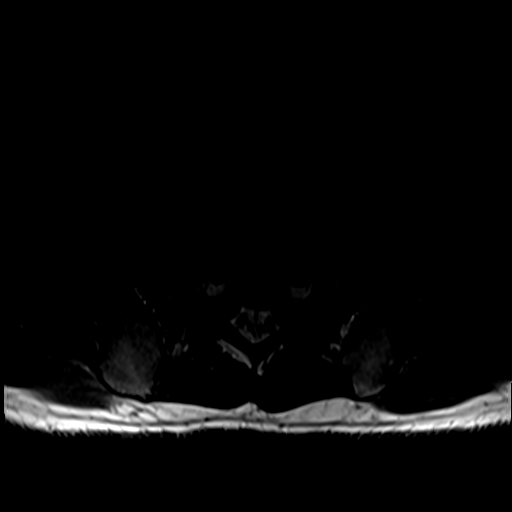
[im 7/43]
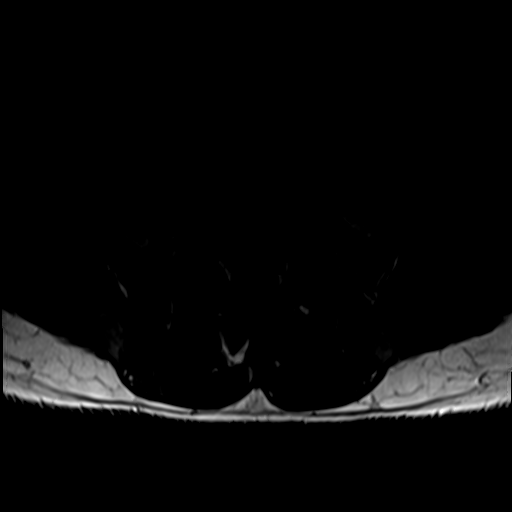
[im 13/43]
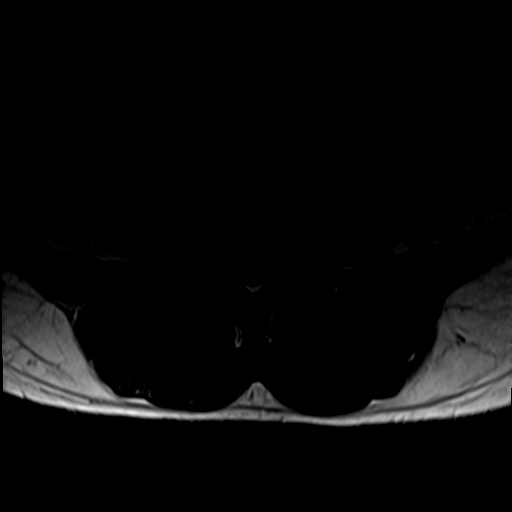
[im 19/43]
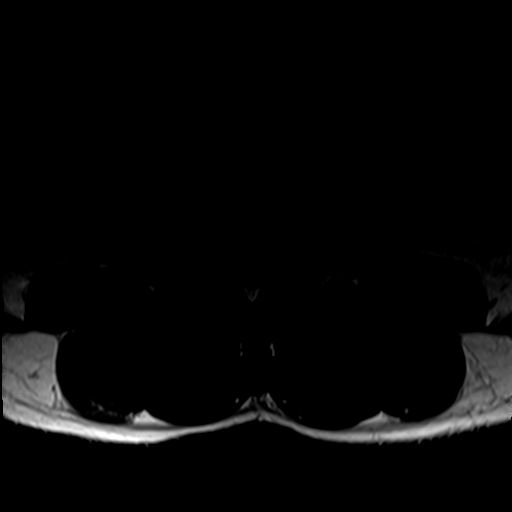
[im 22/43]
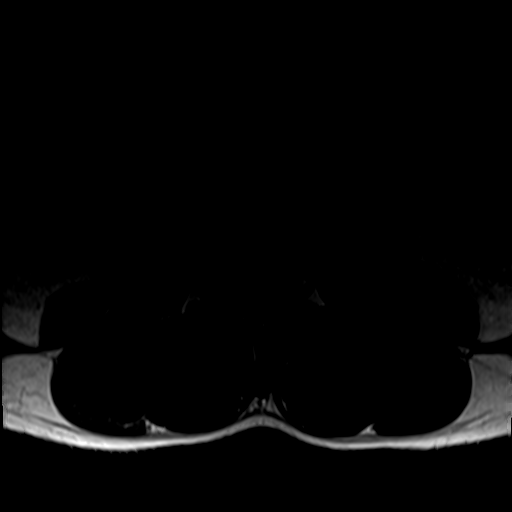
[im 37/43]
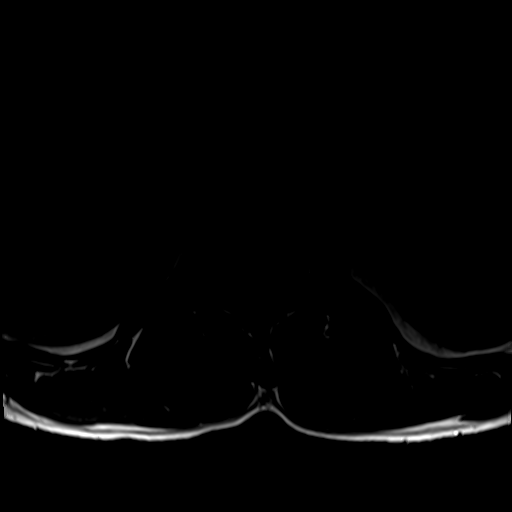

[27 of 48 positions shown; findings below may reference images not displayed]

FINDINGS: Segmentation:  Standard.

Alignment:  Physiologic.

Vertebrae:  No fracture, evidence of discitis, or bone lesion.

Conus medullaris and cauda equina: Conus extends to the L1-2 level.
Conus and cauda equina appear normal.

Paraspinal and other soft tissues: Negative.

Disc levels:

T12-L1: No spinal canal or neural foraminal stenosis.

L1-2: No spinal canal or neural foraminal stenosis.

L2-3: No spinal canal or neural foraminal stenosis.

L3-4: No spinal canal or neural foraminal stenosis.

L4-5: No spinal canal or neural foraminal stenosis.

L5-S1: Mild loss of disc height, tiny posterior disc protrusion
causing small indentation on the thecal sac without significant
spinal canal or neural foraminal stenosis. Mild facet degenerative
changes.
IMPRESSION: Mild degenerative disc disease at L5-S1. No significant spinal canal
or neural foraminal stenosis at any level.

## 2021-04-07 IMAGING — US US SOFT TISSUE
1 series · 10 of 10 positions shown · non-contrast
Comparison: Two-view chest x-ray 07/23/2015. Right rib radiographs
01/19/2020

CLINICAL DATA: Mass over the right chest wall for 2 years.
Discomfort.

EXAM:
ULTRASOUND OF HEAD/NECK SOFT TISSUES
TECHNIQUE: Ultrasound examination of the head and neck soft tissues was
performed in the area of clinical concern.

[Series 1: us soft tissue · 0.04mm/px · 10 acquisitions, 10 frames shown]
[im 1/10]
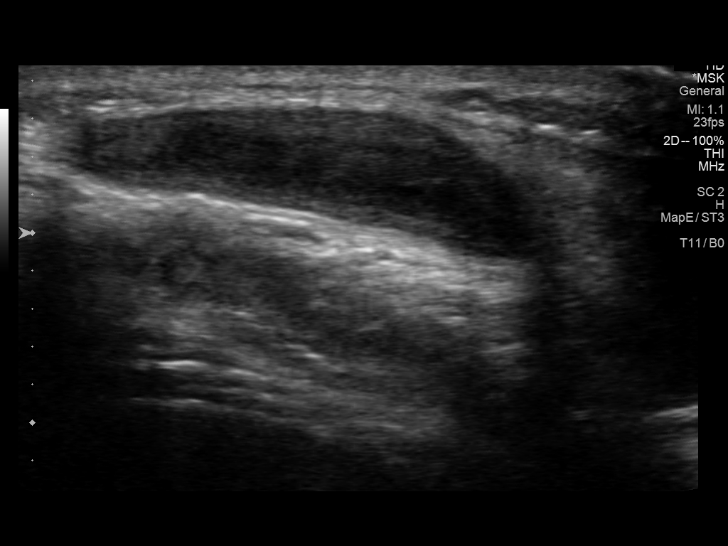
[im 2/10]
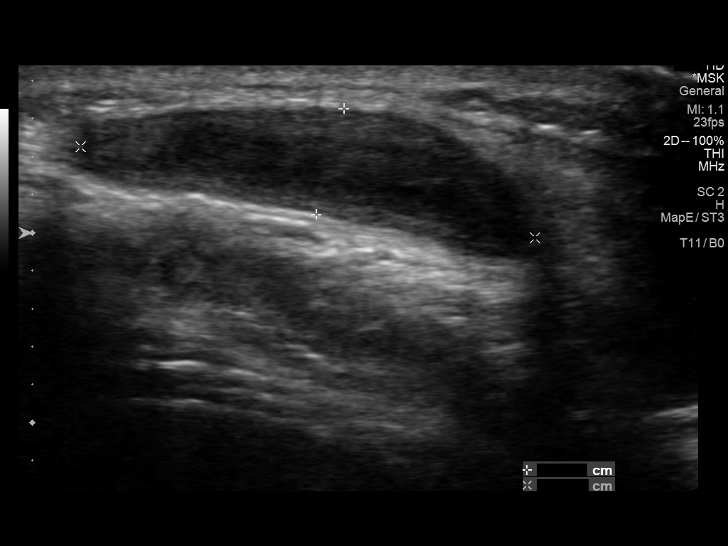
[im 3/10]
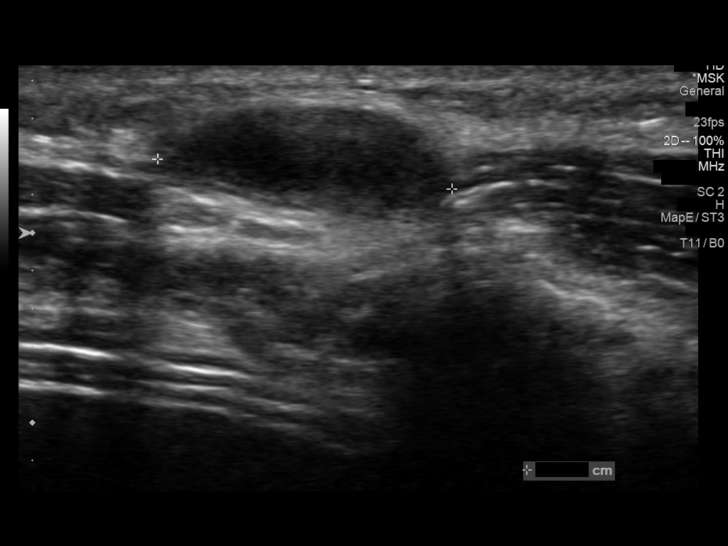
[im 4/10]
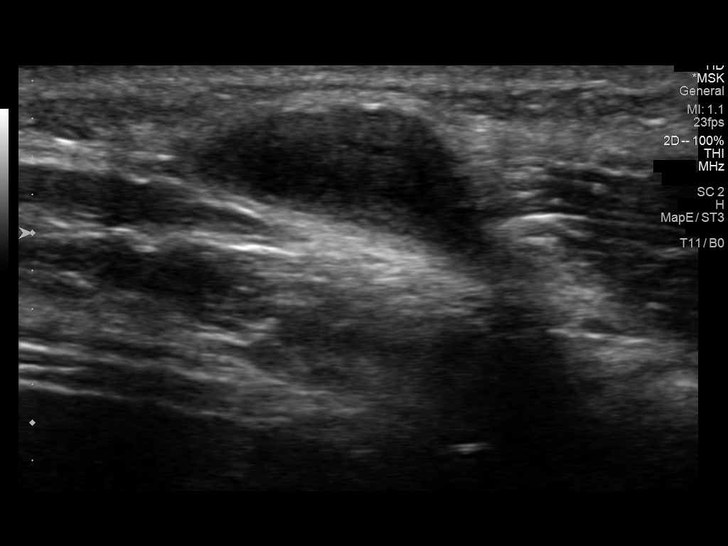
[im 5/10]
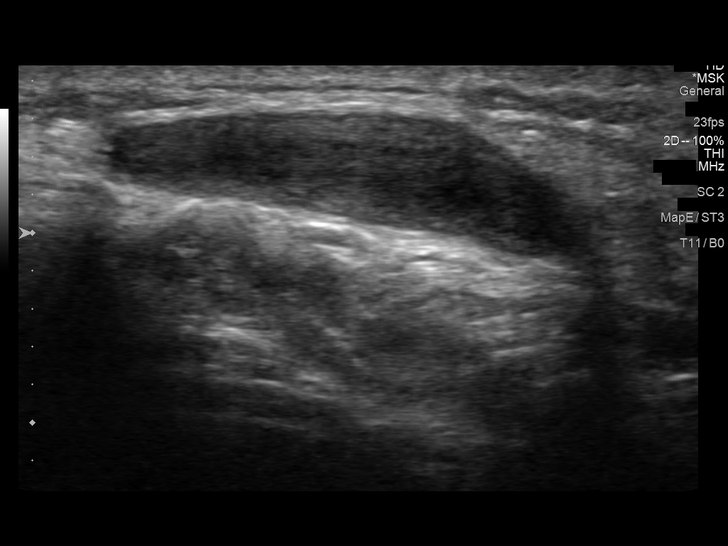
[im 6/10]
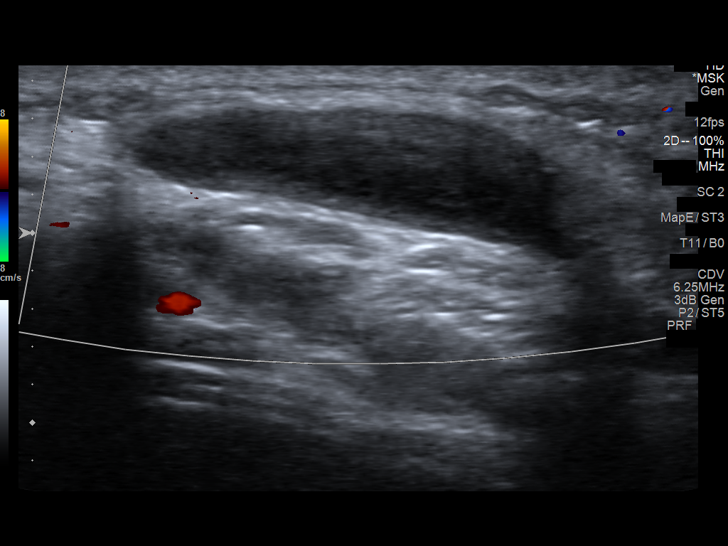
[im 7/10]
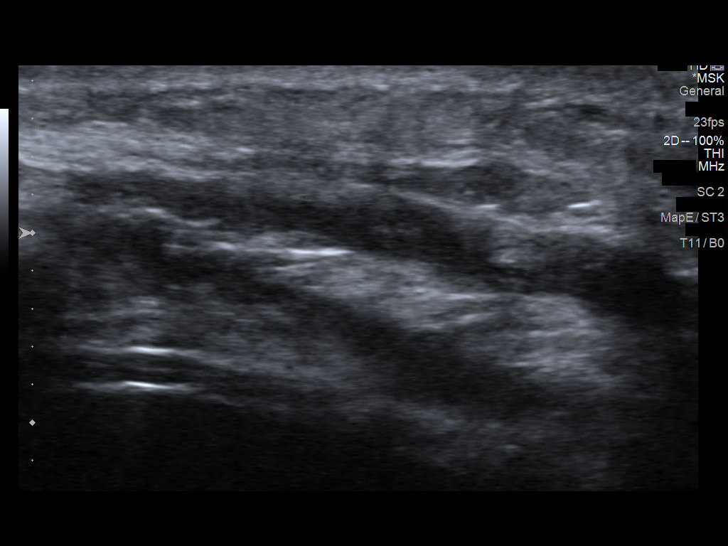
[im 8/10]
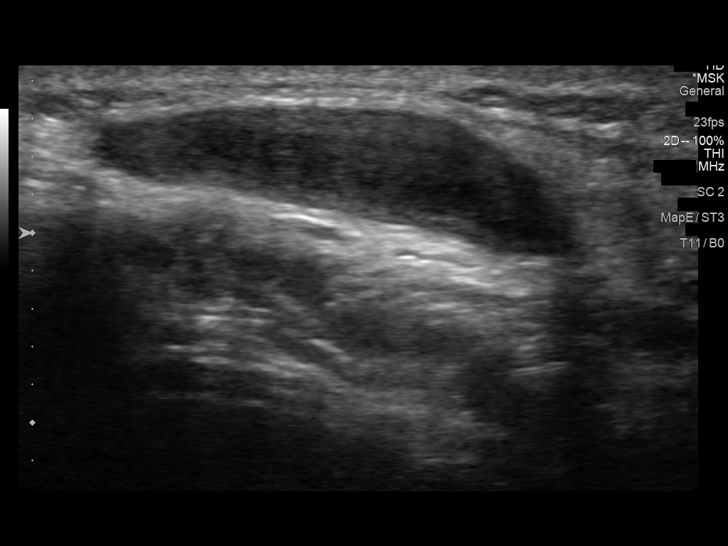
[im 9/10]
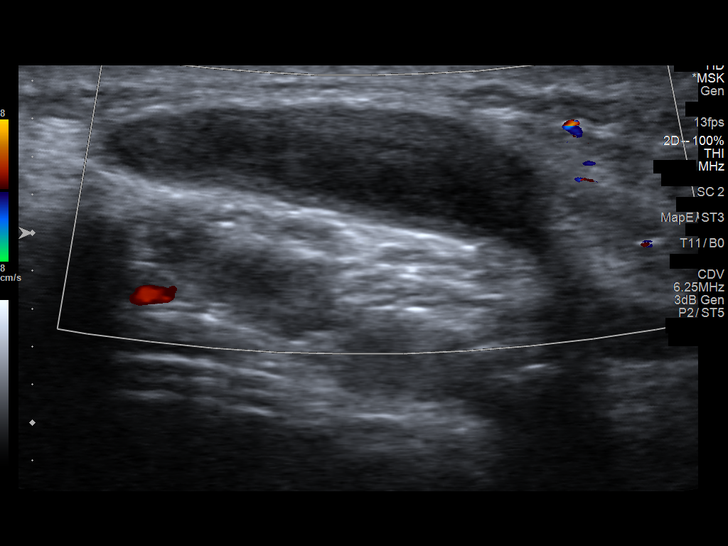
[im 10/10]
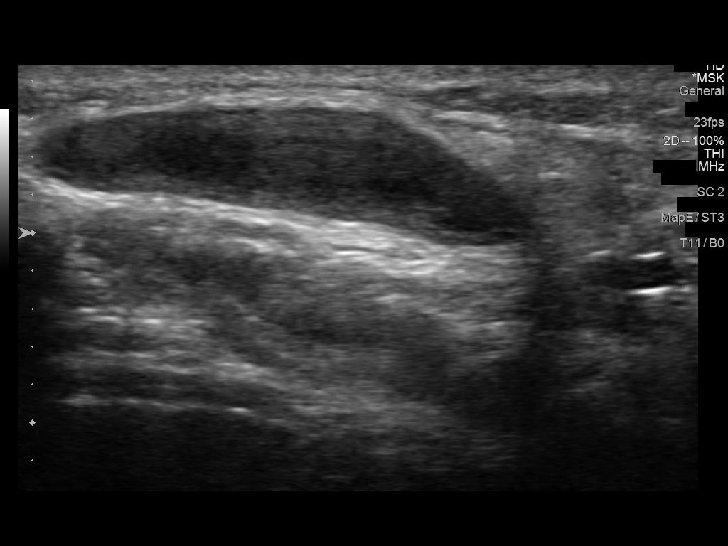

[10 of 10 positions shown; findings below may reference images not displayed]

FINDINGS: Hypoechoic area with increased through transmission is noted over
the right chest wall, measuring 1.6 x 0.6 x 2.4 cm. No solid lesion
is present. There is no hyperemia.
IMPRESSION: Benign-appearing cystic lesion of the right chest wall. No solid
lesion is present. Recommend CT of the chest with contrast for
further evaluation. This most likely represents a benign
Florenda lesion.

## 2021-04-17 ENCOUNTER — Telehealth (INDEPENDENT_AMBULATORY_CARE_PROVIDER_SITE_OTHER): Payer: No Typology Code available for payment source | Admitting: Family Medicine

## 2021-04-17 ENCOUNTER — Encounter: Payer: Self-pay | Admitting: Family Medicine

## 2021-04-17 VITALS — Ht 72.0 in

## 2021-04-17 DIAGNOSIS — L819 Disorder of pigmentation, unspecified: Secondary | ICD-10-CM | POA: Diagnosis not present

## 2021-04-17 DIAGNOSIS — R222 Localized swelling, mass and lump, trunk: Secondary | ICD-10-CM

## 2021-04-17 NOTE — Progress Notes (Signed)
Virtual Visit via Video Note I connected with Edward Beske Sr. on 04/17/21 by a video enabled telemedicine application and verified that I am speaking with the correct person using two identifiers.  Location patient: home Location provider:Home office Persons participating in the virtual visit: patient, provider  I discussed the limitations of evaluation and management by telemedicine and the availability of in person appointments. The patient expressed understanding and agreed to proceed.  Chief Complaint  Patient presents with   Follow-up   HPI: Edward Blackburn is a 40 yo male with hx of chronic back pain c/o skin lesions on anterior aspect of neck, noted a few months ago and growing "fast." Lesions are not tender or pruritic. Negative for easy bleeding. He has not tried OTC treatments. Negative for fever,night sweats,abdominal wt loss,sore throat,oral lesions, or cervicalgia.  He was last seen on 11/17/20, this day follow on chest wall mass, chest CT was ordered. He did not have CT done. States that he met with surgeon and surgical treatment was not recommended for now. He has not noted changes in size or new symptoms. Negative for cough,wheezing,SOB,abdominal pain,N/V,or skin changes on affected area. Chest soft tissue US on 04/25/20: Benign-appearing cystic lesion of the right chest wall. No solid lesion is present.This most likely represents a benign veno-lymphatic lesion.  ROS: See pertinent positives and negatives per HPI.  Past Medical History:  Diagnosis Date   Tobacco abuse disorder    History reviewed. No pertinent surgical history.  Family History  Problem Relation Age of Onset   Heart disease Daughter    Social History   Socioeconomic History   Marital status: Married    Spouse name: Not on file   Number of children: Not on file   Years of education: Not on file   Highest education level: Not on file  Occupational History   Not on file  Tobacco Use   Smoking  status: Every Day   Smokeless tobacco: Never  Substance and Sexual Activity   Alcohol use: Yes   Drug use: Yes    Types: Marijuana   Sexual activity: Not on file  Other Topics Concern   Not on file  Social History Narrative   Not on file   Social Determinants of Health   Financial Resource Strain: Not on file  Food Insecurity: Not on file  Transportation Needs: Not on file  Physical Activity: Not on file  Stress: Not on file  Social Connections: Not on file  Intimate Partner Violence: Not on file    Current Outpatient Medications:    clotrimazole-betamethasone (LOTRISONE) cream, Apply 1 application topically 2 (two) times daily as needed., Disp: 45 g, Rfl: 1  EXAM:  VITALS per patient if applicable:Ht 6' (1.155 m)   BMI 25.31 kg/m   GENERAL: alert, oriented, appears well and in no acute distress  HEENT: atraumatic, conjunctiva clear, no obvious abnormalities on inspection.  NECK: normal movements of the head and neck  LUNGS: on inspection no signs of respiratory distress, breathing rate appears normal, no obvious gross SOB, gasping or wheezing  CV: no obvious cyanosis  SKIN: Pediculated hyperpigmented lesions x 2 on anterior aspect of neck, about 2.5 cm and 1 cm (estimated through video). See pictures.  PSYCH/NEURO: pleasant and cooperative, no obvious depression or anxiety, speech and thought processing grossly intact     ASSESSMENT AND PLAN:  Discussed the following assessment and plan:  Hyperpigmented skin lesion - Plan: Ambulatory referral to Dermatology Benign skin lesions most likely but  growing, so dermatology referral placed.  Mass in chest Stable. Most likely benign. He agrees with continue monitoring for changes.  No problem-specific Assessment & Plan notes found for this encounter.  I discussed the assessment and treatment plan with the patient. The patient was provided an opportunity to ask questions and all were answered. The patient agreed  with the plan and demonstrated an understanding of the instructions.  Return if symptoms worsen or fail to improve.  Edward Martinique, MD

## 2021-09-05 ENCOUNTER — Ambulatory Visit (INDEPENDENT_AMBULATORY_CARE_PROVIDER_SITE_OTHER): Payer: No Typology Code available for payment source

## 2021-09-05 ENCOUNTER — Encounter (HOSPITAL_COMMUNITY): Payer: Self-pay | Admitting: Emergency Medicine

## 2021-09-05 ENCOUNTER — Ambulatory Visit (HOSPITAL_COMMUNITY)
Admission: EM | Admit: 2021-09-05 | Discharge: 2021-09-05 | Disposition: A | Discharge status: Home / Self Care | Payer: No Typology Code available for payment source | Attending: Physician Assistant | Admitting: Physician Assistant

## 2021-09-05 DIAGNOSIS — T148XXA Other injury of unspecified body region, initial encounter: Secondary | ICD-10-CM | POA: Diagnosis not present

## 2021-09-05 DIAGNOSIS — M542 Cervicalgia: Secondary | ICD-10-CM

## 2021-09-05 DIAGNOSIS — M545 Low back pain, unspecified: Secondary | ICD-10-CM | POA: Diagnosis not present

## 2021-09-05 MED ORDER — NAPROXEN 500 MG PO TABS: 500.0000 mg | ORAL_TABLET | Freq: Two times a day (BID) | ORAL | 0 refills | Status: DC | PRN

## 2021-09-05 MED ORDER — TIZANIDINE HCL 4 MG PO TABS: 4.0000 mg | ORAL_TABLET | Freq: Three times a day (TID) | ORAL | 0 refills | Status: DC | PRN
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
## 2022-06-03 NOTE — Progress Notes (Unsigned)
HPI: Mr. Edward Flagg Sr. is a 41 y.o.male with history of tobacco use disorder, prediabetes, and back pain here today for his routine physical examination.  Last CPE: 01/05/20. He reports no new issues since the previous visit.   He is currently attempting to improve his life style. He recently acquired a [employer], he is not exercising regularly but planning on doing so, He admits to frequently eating out and consuming a variety of meats and occasional vegetables.  He currently smokes half a pack of cigarettes per day and expresses a desire to quit. He has not previously tried any smoking cessation treatments.   Immunization History  Administered Date(s) Administered   Tdap 10/05/2019   Health Maintenance  Topic Date Due   COVID-19 Vaccine (1) 06/20/2022 (Originally 05/22/1981)   INFLUENZA VACCINE  10/20/2022 (Originally 02/19/2022)   TETANUS/TDAP  10/04/2029   Hepatitis C Screening  Completed   HIV Screening  Completed   HPV VACCINES  Aged Out   -Concerns and/or follow up today:  Hx of athlete's foot and has previously used Lotrisone cream, which he found effective when used consistently. He reports experiencing difficulty sleeping for the past few weeks to a couple of months, averaging 4-5 hours of sleep per night. He describes tossing and turning and frequent awakenings during the night. He expresses dissatisfaction with his current life situation.     06/04/2022    1:58 PM 06/04/2022    1:54 PM 10/09/2019    2:43 PM  Depression screen PHQ 2/9  Decreased Interest 1 0 0  Down, Depressed, Hopeless 1  0  PHQ - 2 Score 2 0 0  Altered sleeping 3    Tired, decreased energy 3    Change in appetite 2    Feeling bad or failure about yourself  2    Trouble concentrating 2    Moving slowly or fidgety/restless 2    Suicidal thoughts 0    PHQ-9 Score 16    Difficult doing work/chores Somewhat difficult     Prediabetes: Negative for polyphagia, polydipsia,  polyuria. Lab Results  Component Value Date   HGBA1C 6.0 11/17/2020   Review of Systems  Constitutional:  Positive for fatigue. Negative for activity change, appetite change and fever.  HENT:  Negative for mouth sores, nosebleeds, sore throat and trouble swallowing.   Eyes:  Negative for redness and visual disturbance.  Respiratory:  Negative for cough, shortness of breath and wheezing.   Cardiovascular:  Negative for chest pain, palpitations and leg swelling.  Gastrointestinal:  Negative for abdominal pain, blood in stool, nausea and vomiting.  Endocrine: Negative for cold intolerance and heat intolerance.  Genitourinary:  Negative for decreased urine volume, dysuria, genital sores, hematuria and testicular pain.  Musculoskeletal:  Negative for gait problem and myalgias.  Skin:  Positive for rash (feet). Negative for color change and wound.  Neurological:  Negative for syncope, weakness and headaches.  Hematological:  Negative for adenopathy. Does not bruise/bleed easily.  Psychiatric/Behavioral:  Positive for sleep disturbance. Negative for confusion and hallucinations.    No current outpatient medications on file prior to visit.   No current facility-administered medications on file prior to visit.   Past Medical History:  Diagnosis Date   Tobacco abuse disorder    History reviewed. No pertinent surgical history.  No Known Allergies  Family History  Problem Relation Age of Onset   Heart disease Daughter    Social History   Socioeconomic History   Marital status:  Married    Spouse name: Not on file   Number of children: Not on file   Years of education: Not on file   Highest education level: Not on file  Occupational History   Not on file  Tobacco Use   Smoking status: Every Day   Smokeless tobacco: Never  Substance and Sexual Activity   Alcohol use: Yes   Drug use: Yes    Types: Marijuana   Sexual activity: Not on file  Other Topics Concern   Not on file   Social History Narrative   Not on file   Social Determinants of Health   Financial Resource Strain: Not on file  Food Insecurity: Not on file  Transportation Needs: Not on file  Physical Activity: Not on file  Stress: Not on file  Social Connections: Not on file    Vitals:   06/04/22 1353  BP: 122/70  Pulse: 78  Resp: 12  Temp: 98.7 F (37.1 C)  SpO2: 98%  Body mass index is 26.72 kg/m.  Wt Readings from Last 3 Encounters:  06/04/22 197 lb (89.4 kg)  11/17/20 186 lb 9.6 oz (84.6 kg)  07/01/20 189 lb (85.7 kg)   Physical Exam Vitals and nursing note reviewed.  Constitutional:      General: He is not in acute distress.    Appearance: He is well-developed.  HENT:     Head: Normocephalic and atraumatic.     Right Ear: Tympanic membrane, ear canal and external ear normal.     Left Ear: Tympanic membrane, ear canal and external ear normal.  Eyes:     Extraocular Movements: Extraocular movements intact.     Conjunctiva/sclera: Conjunctivae normal.     Pupils: Pupils are equal, round, and reactive to light.  Neck:     Thyroid: No thyromegaly.     Trachea: No tracheal deviation.  Cardiovascular:     Rate and Rhythm: Normal rate and regular rhythm.     Pulses:          Dorsalis pedis pulses are 2+ on the right side and 2+ on the left side.     Heart sounds: No murmur heard. Pulmonary:     Effort: Pulmonary effort is normal. No respiratory distress.     Breath sounds: Normal breath sounds.  Abdominal:     Palpations: Abdomen is soft. There is no hepatomegaly or mass.     Tenderness: There is no abdominal tenderness.  Genitourinary:    Comments: No concerns. Musculoskeletal:        General: No tenderness.     Cervical back: Normal range of motion.     Comments: No major deformities appreciated and no signs of synovitis.  Lymphadenopathy:     Cervical: No cervical adenopathy.     Upper Body:     Right upper body: No supraclavicular adenopathy.     Left upper  body: No supraclavicular adenopathy.  Skin:    General: Skin is warm.     Findings: No erythema or rash.     Comments: Post inflammatory hyperpigmentation changes on toes and whitish, macerated areas in between son toes.  No erythema or edema.  Neurological:     General: No focal deficit present.     Mental Status: He is alert and oriented to person, place, and time.     Cranial Nerves: No cranial nerve deficit.     Sensory: No sensory deficit.     Gait: Gait normal.     Deep Tendon  Reflexes:     Reflex Scores:      Bicep reflexes are 2+ on the right side and 2+ on the left side.      Patellar reflexes are 2+ on the right side and 2+ on the left side. Psychiatric:        Mood and Affect: Mood normal.        Thought Content: Thought content does not include suicidal ideation. Thought content does not include suicidal plan.   ASSESSMENT AND PLAN:  [name] was seen today for annual exam.  Diagnoses and all orders for this visit: The 10-year ASCVD risk score (Arnett DK, et al., 2019) is: 5.6%   Values used to calculate the score:     Age: 65 years     Sex: Male     Is Non-Hispanic African American: Yes     Diabetic: No     Tobacco smoker: Yes     Systolic Blood Pressure: 123XX123 mmHg     Is BP treated: No     HDL Cholesterol: 34.4 mg/dL     Total Cholesterol: 147 mg/dL  Routine general medical examination at a health care facility Assessment & Plan: We discussed the importance of regular physical activity and healthy diet for prevention of chronic illness and/or complications. Preventive guidelines reviewed. Vaccination-today, he declines flu vaccine. Smoking cessation encouraged. Next CPE in a year.   Prediabetes Assessment & Plan: Encourage a healthy lifestyle for diabetes prevention. Further recommendation will be given according to hemoglobin A1c result.  Orders: -     Basic metabolic panel; Future -     Hemoglobin A1c; Future  Tinea pedis of both feet Assessment  & Plan: This is a chronic problem, he has tried oral Lamisil. Resume Lotrisone cream, small amount, twice daily as needed. Soaking feet in vinegar with water ratio 1:1 may also help. Keep feet dry, especially between toes. Monitor for signs of bacterial infection.  Orders: -     Clotrimazole-Betamethasone; Apply 1 Application topically 2 (two) times daily as needed.  Dispense: 45 g; Refill: 1  Tobacco use disorder Assessment & Plan: We discussed adverse effects of tobacco use, he is motivated to quit smoking. We reviewed pharmacologic treatment options, he would like to try nicotine patches, starting with 21 mg daily and in 28 days decrease to 14 and then 7 mg daily.  Orders: -     Nicotine; Place 1 patch (21 mg total) onto the skin daily.  Dispense: 28 patch; Refill: 0  Insomnia, unspecified type Assessment & Plan: Trazodone 50 mg started today. We discussed the importance of adequate sleep hygiene.  Orders: -     traZODone HCl; Take 1 tablet (50 mg total) by mouth at bedtime.  Dispense: 30 tablet; Refill: 2  Screening for lipoid disorders -     Lipid panel; Future  Depression, major, single episode, moderate ([place]) Assessment & Plan: We discussed treatment options. Because he is having difficulty sleeping, he agrees with trying trazodone 50 mg daily, start with 1/2 tablet daily and can increase to 1 tablet if needed. Instructed about warning signs. Follow-up in 2 months, before if needed.  Orders: -     traZODone HCl; Take 1 tablet (50 mg total) by mouth at bedtime.  Dispense: 30 tablet; Refill: 2   Return in about 2 months (around 08/04/2022).  Edward Benham G. [place], MD  [facility]. [place] office.

## 2022-06-04 ENCOUNTER — Ambulatory Visit (INDEPENDENT_AMBULATORY_CARE_PROVIDER_SITE_OTHER): Payer: No Typology Code available for payment source | Admitting: Family Medicine

## 2022-06-04 ENCOUNTER — Encounter: Payer: Self-pay | Admitting: Family Medicine

## 2022-06-04 VITALS — BP 122/70 | HR 78 | Temp 98.7°F | Resp 12 | Ht 72.0 in | Wt 197.0 lb

## 2022-06-04 DIAGNOSIS — F17200 Nicotine dependence, unspecified, uncomplicated: Secondary | ICD-10-CM | POA: Diagnosis not present

## 2022-06-04 DIAGNOSIS — G4700 Insomnia, unspecified: Secondary | ICD-10-CM | POA: Diagnosis not present

## 2022-06-04 DIAGNOSIS — Z13220 Encounter for screening for lipoid disorders: Secondary | ICD-10-CM | POA: Diagnosis not present

## 2022-06-04 DIAGNOSIS — B353 Tinea pedis: Secondary | ICD-10-CM

## 2022-06-04 DIAGNOSIS — Z0000 Encounter for general adult medical examination without abnormal findings: Secondary | ICD-10-CM

## 2022-06-04 DIAGNOSIS — R7303 Prediabetes: Secondary | ICD-10-CM | POA: Diagnosis not present

## 2022-06-04 DIAGNOSIS — F321 Major depressive disorder, single episode, moderate: Secondary | ICD-10-CM

## 2022-06-04 LAB — HEMOGLOBIN A1C: Hgb A1c MFr Bld: 6.5 % (ref 4.6–6.5)

## 2022-06-04 LAB — BASIC METABOLIC PANEL
BUN: 10 mg/dL (ref 6–23)
CO2: 28 mEq/L (ref 19–32)
Calcium: 9.2 mg/dL (ref 8.4–10.5)
Chloride: 107 mEq/L (ref 96–112)
Creatinine, Ser: 0.93 mg/dL (ref 0.40–1.50)
GFR: 101.26 mL/min (ref >60.00)
Glucose, Bld: 99 mg/dL (ref 70–99)
Potassium: 3.9 mEq/L (ref 3.5–5.1)
Sodium: 140 mEq/L (ref 135–145)

## 2022-06-04 LAB — LIPID PANEL
Cholesterol: 147 mg/dL (ref 0–200)
HDL: 34.4 mg/dL — ABNORMAL LOW (ref >39.00)
LDL Cholesterol: 103 mg/dL — ABNORMAL HIGH (ref 0–99)
NonHDL: 112.76
Total CHOL/HDL Ratio: 4
Triglycerides: 49 mg/dL (ref 0.0–149.0)
VLDL: 9.8 mg/dL (ref 0.0–40.0)

## 2022-06-04 MED ORDER — TRAZODONE HCL 50 MG PO TABS: 25.0000 mg | ORAL_TABLET | Freq: Every evening | ORAL | 2 refills | Status: DC | PRN

## 2022-06-04 MED ORDER — NICOTINE 21 MG/24HR TD PT24: 21.0000 mg | MEDICATED_PATCH | Freq: Every day | TRANSDERMAL | 0 refills | Status: DC

## 2022-06-04 MED ORDER — CLOTRIMAZOLE-BETAMETHASONE 1-0.05 % EX CREA: 1.0000 | TOPICAL_CREAM | Freq: Two times a day (BID) | CUTANEOUS | 1 refills | Status: DC | PRN

## 2022-06-04 MED ORDER — TRAZODONE HCL 50 MG PO TABS: 50.0000 mg | ORAL_TABLET | Freq: Every day | ORAL | 2 refills | Status: DC

## 2022-06-04 NOTE — Assessment & Plan Note (Signed)
Trazodone 50 mg started today. We discussed the importance of adequate sleep hygiene.

## 2022-06-04 NOTE — Assessment & Plan Note (Signed)
This is a chronic problem, he has tried oral Lamisil. Resume Lotrisone cream, small amount, twice daily as needed. Soaking feet in vinegar with water ratio 1:1 may also help. Keep feet dry, especially between toes. Monitor for signs of bacterial infection.

## 2022-06-04 NOTE — Assessment & Plan Note (Signed)
We discussed the importance of regular physical activity and healthy diet for prevention of chronic illness and/or complications. Preventive guidelines reviewed. Vaccination-today, he declines flu vaccine. Smoking cessation encouraged. Next CPE in a year.

## 2022-06-04 NOTE — Assessment & Plan Note (Signed)
Encourage a healthy lifestyle for diabetes prevention. Further recommendation will be given according to hemoglobin A1c result.

## 2022-06-04 NOTE — Assessment & Plan Note (Signed)
We discussed treatment options. Because he is having difficulty sleeping, he agrees with trying trazodone 50 mg daily, start with 1/2 tablet daily and can increase to 1 tablet if needed. Instructed about warning signs. Follow-up in 2 months, before if needed.

## 2022-06-04 NOTE — Patient Instructions (Addendum)
A few things to remember from today's visit:  Routine general medical examination at a health care facility  Prediabetes - Plan: Basic metabolic panel, Hemoglobin A1c  Tinea pedis of both feet - Plan: clotrimazole-betamethasone (LOTRISONE) cream  Tobacco use disorder - Plan: nicotine (NICODERM CQ - DOSED IN MG/24 HOURS) 21 mg/24hr patch  Insomnia, unspecified type - Plan: traZODone (DESYREL) 50 MG tablet  Screening for lipoid disorders - Plan: Lipid panel  Trazodone 1/2-1 tab 30 min before bedtime. Lotrisone on affected area of feet, small amount at the time.  If you need refills for medications you take chronically, please call your pharmacy. Do not use My Chart to request refills or for acute issues that need immediate attention. If you send a my chart message, it may take a few days to be addressed, specially if I am not in the office.  Please be sure medication list is accurate. If a new problem present, please set up appointment sooner than planned today.  Health Maintenance, Male Adopting a healthy lifestyle and getting preventive care are important in promoting health and wellness. Ask your health care provider about: The right schedule for you to have regular tests and exams. Things you can do on your own to prevent diseases and keep yourself healthy. What should I know about diet, weight, and exercise? Eat a healthy diet  Eat a diet that includes plenty of vegetables, fruits, low-fat dairy products, and lean protein. Do not eat a lot of foods that are high in solid fats, added sugars, or sodium. Maintain a healthy weight Body mass index (BMI) is a measurement that can be used to identify possible weight problems. It estimates body fat based on height and weight. Your health care provider can help determine your BMI and help you achieve or maintain a healthy weight. Get regular exercise Get regular exercise. This is one of the most important things you can do for your  health. Most adults should: Exercise for at least 150 minutes each week. The exercise should increase your heart rate and make you sweat (moderate-intensity exercise). Do strengthening exercises at least twice a week. This is in addition to the moderate-intensity exercise. Spend less time sitting. Even light physical activity can be beneficial. Watch cholesterol and blood lipids Have your blood tested for lipids and cholesterol at 42 years of age, then have this test every 5 years. You may need to have your cholesterol levels checked more often if: Your lipid or cholesterol levels are high. You are older than 42 years of age. You are at high risk for heart disease. What should I know about cancer screening? Many types of cancers can be detected early and may often be prevented. Depending on your health history and family history, you may need to have cancer screening at various ages. This may include screening for: Colorectal cancer. Prostate cancer. Skin cancer. Lung cancer. What should I know about heart disease, diabetes, and high blood pressure? Blood pressure and heart disease High blood pressure causes heart disease and increases the risk of stroke. This is more likely to develop in people who have high blood pressure readings or are overweight. Talk with your health care provider about your target blood pressure readings. Have your blood pressure checked: Every 3-5 years if you are 17-60 years of age. Every year if you are 56 years old or older. If you are between the ages of 37 and 28 and are a current or former smoker, ask your health care provider  if you should have a one-time screening for abdominal aortic aneurysm (AAA). Diabetes Have regular diabetes screenings. This checks your fasting blood sugar level. Have the screening done: Once every three years after age 57 if you are at a normal weight and have a low risk for diabetes. More often and at a younger age if you are  overweight or have a high risk for diabetes. What should I know about preventing infection? Hepatitis B If you have a higher risk for hepatitis B, you should be screened for this virus. Talk with your health care provider to find out if you are at risk for hepatitis B infection. Hepatitis C Blood testing is recommended for: Everyone born from [birth year] through 1966. Anyone with known risk factors for hepatitis C. Sexually transmitted infections (STIs) You should be screened each year for STIs, including gonorrhea and chlamydia, if: You are sexually active and are younger than 42 years of age. You are older than 42 years of age and your health care provider tells you that you are at risk for this type of infection. Your sexual activity has changed since you were last screened, and you are at increased risk for chlamydia or gonorrhea. Ask your health care provider if you are at risk. Ask your health care provider about whether you are at high risk for HIV. Your health care provider may recommend a prescription medicine to help prevent HIV infection. If you choose to take medicine to prevent HIV, you should first get tested for HIV. You should then be tested every 3 months for as long as you are taking the medicine. Follow these instructions at home: Alcohol use Do not drink alcohol if your health care provider tells you not to drink. If you drink alcohol: Limit how much you have to 0-2 drinks a day. Know how much alcohol is in your drink. In the [place] one drink equals one 12 oz bottle of beer (355 mL), one 5 oz glass of wine (148 mL), or one 1 oz glass of hard liquor (44 mL). Lifestyle Do not use any products that contain nicotine or tobacco. These products include cigarettes, chewing tobacco, and vaping devices, such as e-cigarettes. If you need help quitting, ask your health care provider. Do not use street drugs. Do not share needles. Ask your health care provider for help if you need support or  information about quitting drugs. General instructions Schedule regular health, dental, and eye exams. Stay current with your vaccines. Tell your health care provider if: You often feel depressed. You have ever been abused or do not feel safe at home. Summary Adopting a healthy lifestyle and getting preventive care are important in promoting health and wellness. Follow your health care provider's instructions about healthy diet, exercising, and getting tested or screened for diseases. Follow your health care provider's instructions on monitoring your cholesterol and blood pressure. This information is not intended to replace advice given to you by your health care provider. Make sure you discuss any questions you have with your health care provider. Document Revised: 11/27/2020 Document Reviewed: 11/27/2020 Elsevier Patient Education  [place].

## 2022-06-04 NOTE — Assessment & Plan Note (Signed)
We discussed adverse effects of tobacco use, he is motivated to quit smoking. We reviewed pharmacologic treatment options, he would like to try nicotine patches, starting with 21 mg daily and in 28 days decrease to 14 and then 7 mg daily.

## 2022-07-03 ENCOUNTER — Encounter (HOSPITAL_COMMUNITY): Payer: Self-pay | Admitting: Emergency Medicine

## 2022-07-03 ENCOUNTER — Emergency Department (HOSPITAL_COMMUNITY)
Admission: EM | Admit: 2022-07-03 | Discharge: 2022-07-04 | Discharge status: Against Medical Advice | Payer: No Typology Code available for payment source | Attending: Student | Admitting: Student

## 2022-07-03 ENCOUNTER — Emergency Department (HOSPITAL_COMMUNITY): Payer: No Typology Code available for payment source

## 2022-07-03 DIAGNOSIS — R059 Cough, unspecified: Secondary | ICD-10-CM | POA: Diagnosis not present

## 2022-07-03 DIAGNOSIS — R0989 Other specified symptoms and signs involving the circulatory and respiratory systems: Secondary | ICD-10-CM | POA: Insufficient documentation

## 2022-07-03 DIAGNOSIS — Z5321 Procedure and treatment not carried out due to patient leaving prior to being seen by health care provider: Secondary | ICD-10-CM | POA: Insufficient documentation

## 2022-07-03 NOTE — ED Triage Notes (Signed)
Pt reports cough and chest pain associated with cough. Some nasal congestion. It has been present for a week and gets worse at nighttime.

## 2022-07-03 NOTE — ED Provider Triage Note (Signed)
Emergency Medicine Provider Triage Evaluation Note  Edward Lucier Sr. , a 41 y.o. male  was evaluated in triage.  Pt complains of URI-like symptoms, going on for about a week's time, he states has chest congestion, and felt some chest pain only when he coughs, no pleuritic chest pain no shortness of breath, he states he has a sore throat is mainly worse at nighttime, he is not immunocompromise, does not admit to any recent sick contacts.  He has not gotten his COVID or flu vaccine.  He states that he was tested for both COVID and the flu and they are both negative, no recent surgeries no long immobilization no history PEs or DVTs.  Review of Systems  Positive: Congestion, cough Negative: Chest pain, shortness of breath  Physical Exam  There were no vitals taken for this visit. Gen:   Awake, no distress   Resp:  Normal effort  MSK:   Moves extremities without difficulty  Other:    Medical Decision Making  Medically screening exam initiated at 11:36 PM.  Appropriate orders placed.  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  Chest x-ray and strep are both ordered will need further evaluation.   Edward Sage, PA-C 07/03/22 [id number]

## 2022-07-04 ENCOUNTER — Telehealth: Payer: Self-pay

## 2022-07-04 LAB — GROUP A STREP BY PCR: Group A Strep by PCR: NOT DETECTED

## 2022-07-04 NOTE — Telephone Encounter (Signed)
Transition Care Management Follow-up Telephone Call Date of discharge and from where: 07/04/22 from [facility] ED. Left AMA. How have you been since you were released from the hospital? Doing a lot better. Felt better when he got to ED. Any questions or concerns? No    Follow up appointments reviewed:  PCP Hospital f/u appt confirmed? No  Pt declines OV with PCP. If their condition worsens, is the pt aware to call PCP or go to the Emergency Dept.? Yes Was the patient provided with contact information for the PCP's office or ED? Yes Was to pt encouraged to call back with questions or concerns? Yes

## 2022-07-04 NOTE — ED Notes (Signed)
Pt seen leaving ED 

## 2022-07-12 ENCOUNTER — Ambulatory Visit
Admission: EM | Admit: 2022-07-12 | Discharge: 2022-07-12 | Disposition: A | Discharge status: Home / Self Care | Payer: No Typology Code available for payment source | Attending: Urgent Care | Admitting: Urgent Care

## 2022-07-12 ENCOUNTER — Ambulatory Visit (HOSPITAL_COMMUNITY): Admit: 2022-07-12 | Payer: No Typology Code available for payment source

## 2022-07-12 DIAGNOSIS — F17200 Nicotine dependence, unspecified, uncomplicated: Secondary | ICD-10-CM | POA: Diagnosis not present

## 2022-07-12 DIAGNOSIS — J019 Acute sinusitis, unspecified: Secondary | ICD-10-CM | POA: Diagnosis not present

## 2022-07-12 DIAGNOSIS — F1210 Cannabis abuse, uncomplicated: Secondary | ICD-10-CM

## 2022-07-12 MED ORDER — PROMETHAZINE-DM 6.25-15 MG/5ML PO SYRP: 5.0000 mL | ORAL_SOLUTION | Freq: Three times a day (TID) | ORAL | 0 refills | Status: DC | PRN

## 2022-07-12 MED ORDER — ALBUTEROL SULFATE HFA 108 (90 BASE) MCG/ACT IN AERS: 1.0000 | INHALATION_SPRAY | Freq: Four times a day (QID) | RESPIRATORY_TRACT | 0 refills | Status: DC | PRN

## 2022-07-12 MED ORDER — CETIRIZINE HCL 10 MG PO TABS: 10.0000 mg | ORAL_TABLET | Freq: Every day | ORAL | 0 refills | Status: DC

## 2022-07-12 MED ORDER — PREDNISONE 50 MG PO TABS: 50.0000 mg | ORAL_TABLET | Freq: Every day | ORAL | 0 refills | Status: DC

## 2022-07-12 MED ORDER — AMOXICILLIN-POT CLAVULANATE 875-125 MG PO TABS: 1.0000 | ORAL_TABLET | Freq: Two times a day (BID) | ORAL | 0 refills | Status: DC

## 2022-07-12 NOTE — ED Provider Notes (Signed)
[facility]  Note:  This document was prepared using Systems analyst and may include unintentional dictation errors.  MRN: [id number] DOB: 05-Jan-1981  Subjective:   Edward Ned Sr. is a 41 y.o. male presenting for 8-day history of acute onset persistent and worsening sinus congestion, sinus pressure, body pains, coughing, chills, chest tightness and wheezing. Has had negative COVID testing at home.  Patient smokes 1 pack/day, also smokes marijuana.  No history of respiratory disorders.  No current facility-administered medications for this encounter.  Current Outpatient Medications:    clotrimazole-betamethasone (LOTRISONE) cream, Apply 1 Application topically 2 (two) times daily as needed., Disp: 45 g, Rfl: 1   nicotine (NICODERM CQ - DOSED IN MG/24 HOURS) 21 mg/24hr patch, Place 1 patch (21 mg total) onto the skin daily., Disp: 28 patch, Rfl: 0   traZODone (DESYREL) 50 MG tablet, Take 1 tablet (50 mg total) by mouth at bedtime., Disp: 30 tablet, Rfl: 2   No Known Allergies  Past Medical History:  Diagnosis Date   Tobacco abuse disorder      History reviewed. No pertinent surgical history.  Family History  Problem Relation Age of Onset   Heart disease Daughter     Social History   Tobacco Use   Smoking status: Every Day    Types: Cigarettes   Smokeless tobacco: Never  Vaping Use   Vaping Use: Never used  Substance Use Topics   Alcohol use: Yes   Drug use: Yes    Types: Marijuana    ROS   Objective:   Vitals: BP 137/78 (BP Location: Left Arm)   Pulse 78   Temp (!) 100.7 F (38.2 C) (Oral)   Resp 18   SpO2 96%   Physical Exam Constitutional:      General: He is not in acute distress.    Appearance: Normal appearance. He is well-developed and normal weight. He is not ill-appearing, toxic-appearing or diaphoretic.  HENT:     Head: Normocephalic and atraumatic.     Right Ear: Tympanic membrane, ear canal and  external ear normal. No drainage, swelling or tenderness. No middle ear effusion. There is no impacted cerumen. Tympanic membrane is not erythematous or bulging.     Left Ear: Tympanic membrane, ear canal and external ear normal. No drainage, swelling or tenderness.  No middle ear effusion. There is no impacted cerumen. Tympanic membrane is not erythematous or bulging.     Nose: Congestion and rhinorrhea present.     Mouth/Throat:     Mouth: Mucous membranes are moist.     Pharynx: No oropharyngeal exudate or posterior oropharyngeal erythema.  Eyes:     General: No scleral icterus.       Right eye: No discharge.        Left eye: No discharge.     Extraocular Movements: Extraocular movements intact.     Conjunctiva/sclera: Conjunctivae normal.  Cardiovascular:     Rate and Rhythm: Normal rate and regular rhythm.     Heart sounds: Normal heart sounds. No murmur heard.    No friction rub. No gallop.  Pulmonary:     Effort: Pulmonary effort is normal. No respiratory distress.     Breath sounds: Normal breath sounds. No stridor. No wheezing, rhonchi or rales.  Musculoskeletal:     Cervical back: Normal range of motion and neck supple. No rigidity. No muscular tenderness.  Neurological:     General: No focal deficit present.     Mental  Status: He is alert and oriented to person, place, and time.  Psychiatric:        Mood and Affect: Mood normal.        Behavior: Behavior normal.        Thought Content: Thought content normal.     Assessment and Plan :   PDMP not reviewed this encounter.  1. Acute sinusitis, recurrence not specified, unspecified location   2. Smoker   3. Marijuana abuse     Recommended managing his sinusitis with Augmentin.  Given his respiratory symptoms and active smoking, also recommended a oral prednisone course and provided him with an albuterol inhaler.  Use supportive care otherwise. Deferred imaging given clear cardiopulmonary exam, hemodynamically stable  vital signs. Counseled patient on potential for adverse effects with medications prescribed/recommended today, ER and return-to-clinic precautions discussed, patient verbalized understanding.    Wallis Bamberg, [place] 07/13/22 [id number]

## 2022-07-12 NOTE — ED Triage Notes (Signed)
Pt c/o cough, congestion, body ache, chills x 1 week-NAD-steady gait

## 2022-08-05 ENCOUNTER — Telehealth (INDEPENDENT_AMBULATORY_CARE_PROVIDER_SITE_OTHER): Payer: No Typology Code available for payment source | Admitting: Family Medicine

## 2022-08-05 ENCOUNTER — Encounter: Payer: Self-pay | Admitting: Family Medicine

## 2022-08-05 ENCOUNTER — Telehealth: Payer: Self-pay | Admitting: Family Medicine

## 2022-08-05 VITALS — Ht 72.0 in

## 2022-08-05 DIAGNOSIS — F17200 Nicotine dependence, unspecified, uncomplicated: Secondary | ICD-10-CM | POA: Diagnosis not present

## 2022-08-05 DIAGNOSIS — F321 Major depressive disorder, single episode, moderate: Secondary | ICD-10-CM

## 2022-08-05 DIAGNOSIS — G4700 Insomnia, unspecified: Secondary | ICD-10-CM | POA: Diagnosis not present

## 2022-08-05 MED ORDER — TRAZODONE HCL 50 MG PO TABS: 50.0000 mg | ORAL_TABLET | Freq: Every day | ORAL | 1 refills | Status: DC

## 2022-08-05 NOTE — Assessment & Plan Note (Signed)
Trazodone did help, he would like to resume medication. Trazodone 50 mg daily at bedtime. Good sleep hygiene is also recommended. Follow-up in 3 to 4 months.

## 2022-08-05 NOTE — Telephone Encounter (Signed)
Called patient to schedule appointment. Patient was at work and unable to schedule. He stated that he would have to check his work schedule and give [place] a call back.       FYI

## 2022-08-05 NOTE — Assessment & Plan Note (Signed)
He is reporting some improvement. He would like to resume trazodone 50 mg daily at bedtime. Follow-up in 3 to 4 months, before if needed.

## 2022-08-05 NOTE — Assessment & Plan Note (Signed)
He understands the adverse effects of tobacco use and benefits of smoking cessation. Planning on getting the nicotine patches.

## 2022-08-05 NOTE — Telephone Encounter (Signed)
-----  Message from Betty G [place], MD sent at 08/05/2022  7:47 AM EST ----- 3-4 months f/u. Thanks

## 2022-08-05 NOTE — Progress Notes (Signed)
Virtual Visit via Video Note I connected with Brylen Wagar Sr. on 08/05/22 by a video enabled telemedicine application and verified that I am speaking with the correct person using two identifiers. Location patient: In his car at work. Location provider:work office Persons participating in the virtual visit: patient, provider  I discussed the limitations of evaluation and management by telemedicine and the availability of in person appointments. The patient expressed understanding and agreed to proceed.  Chief Complaint  Patient presents with   Follow-up   HPI: Mr. Hynes is a 42 year old male with past medical history significant for chronic back pain, tobacco use disorder, prediabetes following today on depression and anxiety. He was last seen on 06/04/22. Last visit he was complaining of depressed mood and insomnia, trazodone 50 mg was recommended.  He took medication for about two weeks before discontinuing due to acute respiratory illness around Thanksgiving. He was prescribed promethazine, prednisone, cetirizine, albuterol, and amoxicillin and was not sure if he could continue trazodone, she will discontinue. States that he later visited another urgent care due to persistent symptoms, he tested positive for flu A. Negative for fever, chills, sore throat, cough, wheezing, dyspnea, abdominal pain, changes in bowel habits, urinary symptoms, or skin rash.  He states that during the time he took the medication, he was able to sleep better and felt better in general. The patient's mood has improved since recovering from the flu. Negative for suicidal thought. He is still smoking, last visit nicotine patches recommended.  ROS: See pertinent positives and negatives per HPI.  Past Medical History:  Diagnosis Date   Tobacco abuse disorder    History reviewed. No pertinent surgical history.  Family History  Problem Relation Age of Onset   Heart disease Daughter    Social History    Socioeconomic History   Marital status: Married    Spouse name: Not on file   Number of children: Not on file   Years of education: Not on file   Highest education level: Not on file  Occupational History   Not on file  Tobacco Use   Smoking status: Every Day    Types: Cigarettes   Smokeless tobacco: Never  Vaping Use   Vaping Use: Never used  Substance and Sexual Activity   Alcohol use: Yes   Drug use: Yes    Types: Marijuana   Sexual activity: Not on file  Other Topics Concern   Not on file  Social History Narrative   Not on file   Social Determinants of Health   Financial Resource Strain: Not on file  Food Insecurity: Not on file  Transportation Needs: Not on file  Physical Activity: Not on file  Stress: Not on file  Social Connections: Not on file  Intimate Partner Violence: Not on file    Current Outpatient Medications:    cetirizine (ZYRTEC ALLERGY) 10 MG tablet, Take 1 tablet (10 mg total) by mouth daily., Disp: 30 tablet, Rfl: 0   clotrimazole-betamethasone (LOTRISONE) cream, Apply 1 Application topically 2 (two) times daily as needed., Disp: 45 g, Rfl: 1   nicotine (NICODERM CQ - DOSED IN MG/24 HOURS) 21 mg/24hr patch, Place 1 patch (21 mg total) onto the skin daily., Disp: 28 patch, Rfl: 0   traZODone (DESYREL) 50 MG tablet, Take 1 tablet (50 mg total) by mouth at bedtime., Disp: 90 tablet, Rfl: 1  EXAM:  VITALS per patient if applicable:Ht 6' (4.010 m)   BMI 26.72 kg/m   GENERAL: alert, oriented, appears well  and in no acute distress  HEENT: atraumatic, conjunctiva clear, no obvious abnormalities on inspection of external nose and ears  NECK: normal movements of the head and neck  LUNGS: on inspection no signs of respiratory distress, breathing rate appears normal, no obvious gross SOB, gasping or wheezing  CV: no obvious cyanosis  MS: moves all visible extremities without noticeable abnormality  PSYCH/NEURO: pleasant and cooperative, no  obvious depression or anxiety, speech and thought processing grossly intact  ASSESSMENT AND PLAN:  Discussed the following assessment and plan:  Insomnia, unspecified type Assessment & Plan: Trazodone did help, he would like to resume medication. Trazodone 50 mg daily at bedtime. Good sleep hygiene is also recommended. Follow-up in 3 to 4 months.  Orders: -     traZODone HCl; Take 1 tablet (50 mg total) by mouth at bedtime.  Dispense: 90 tablet; Refill: 1  Depression, major, single episode, moderate (HCC) Assessment & Plan: He is reporting some improvement. He would like to resume trazodone 50 mg daily at bedtime. Follow-up in 3 to 4 months, before if needed.  Orders: -     traZODone HCl; Take 1 tablet (50 mg total) by mouth at bedtime.  Dispense: 90 tablet; Refill: 1  Tobacco use disorder Assessment & Plan: He understands the adverse effects of tobacco use and benefits of smoking cessation. Planning on getting the nicotine patches.    We discussed possible serious and likely etiologies, options for evaluation and workup, limitations of telemedicine visit vs in person visit, treatment, treatment risks and precautions. The patient was advised to call back or seek an in-person evaluation if the symptoms worsen or if the condition fails to improve as anticipated. I discussed the assessment and treatment plan with the patient. The patient was provided an opportunity to ask questions and all were answered. The patient agreed with the plan and demonstrated an understanding of the instructions.  Return in about 4 months (around 12/04/2022) for chronic problems.  Gwenivere Hiraldo G. [place], MD  [facility]. [place] office.

## 2022-11-11 ENCOUNTER — Encounter (HOSPITAL_COMMUNITY): Payer: Self-pay | Admitting: Emergency Medicine

## 2022-11-11 ENCOUNTER — Ambulatory Visit (HOSPITAL_COMMUNITY)
Admission: EM | Admit: 2022-11-11 | Discharge: 2022-11-11 | Disposition: A | Discharge status: Home / Self Care | Payer: No Typology Code available for payment source | Attending: Internal Medicine | Admitting: Internal Medicine

## 2022-11-11 DIAGNOSIS — A084 Viral intestinal infection, unspecified: Secondary | ICD-10-CM | POA: Diagnosis not present

## 2022-11-11 MED ORDER — ONDANSETRON 4 MG PO TBDP: 4.0000 mg | ORAL_TABLET | Freq: Three times a day (TID) | ORAL | 0 refills | Status: DC | PRN

## 2022-11-11 MED ORDER — IBUPROFEN 600 MG PO TABS: 600.0000 mg | ORAL_TABLET | Freq: Four times a day (QID) | ORAL | 0 refills | Status: DC | PRN

## 2022-11-11 NOTE — ED Provider Notes (Signed)
[facility]    CSN: [id number] Arrival date & time: 11/11/22  1023      History   Chief Complaint Chief Complaint  Patient presents with   Headache   Chills   Diarrhea    HPI Edward Bourbon Sr. is a 42 y.o. male was to the urgent care with 3-day history of headaches, generalized bodyaches, subjective fever with chills and nausea.  Patient's symptoms started 3 days ago.  Patient has taken ibuprofen for the headache with no significant relief.  No shortness of breath or wheezing.  Patient's children have similar symptoms.  No headache, dizziness or confusion.  Patient is experiencing some nausea and has had several episodes of loose/watery bowel movements today.  No abdominal bloating.  HPI  Past Medical History:  Diagnosis Date   Tobacco abuse disorder     Patient Active Problem List   Diagnosis Date Noted   Prediabetes 06/04/2022   Insomnia 06/04/2022   Depression, major, single episode, moderate 06/04/2022   Tinea pedis of both feet 06/04/2022   Routine general medical examination at a health care facility 06/04/2022   Tobacco use disorder 06/04/2022   Numbness and tingling of both lower extremities 08/24/2019   Chronic low back pain 08/24/2019   Headache, unspecified headache type 08/24/2019    History reviewed. No pertinent surgical history.     Home Medications    Prior to Admission medications   Medication Sig Start Date End Date Taking? Authorizing Provider  ibuprofen (ADVIL) 600 MG tablet Take 1 tablet (600 mg total) by mouth every 6 (six) hours as needed. 11/11/22  Yes Noreen Mackintosh, Britta Mccreedy, MD  ondansetron (ZOFRAN-ODT) 4 MG disintegrating tablet Take 1 tablet (4 mg total) by mouth every 8 (eight) hours as needed for nausea or vomiting. 11/11/22  Yes Devota Viruet, Britta Mccreedy, MD    Family History Family History  Problem Relation Age of Onset   Heart disease Daughter     Social History Social History   Tobacco Use   Smoking status: Every Day     Types: Cigarettes   Smokeless tobacco: Never  Vaping Use   Vaping Use: Never used  Substance Use Topics   Alcohol use: Yes   Drug use: Yes    Types: Marijuana     Allergies   Patient has no known allergies.   Review of Systems Review of Systems As per HPI  Physical Exam Triage Vital Signs ED Triage Vitals  Enc Vitals Group     BP 11/11/22 1204 (!) 150/98     Pulse Rate 11/11/22 1204 72     Resp 11/11/22 1204 16     Temp 11/11/22 1204 98.2 F (36.8 C)     Temp Source 11/11/22 1204 Oral     SpO2 11/11/22 1204 98 %     Weight --      Height --      Head Circumference --      Peak Flow --      Pain Score 11/11/22 1203 7     Pain Loc --      Pain Edu? --      Excl. in GC? --    No data found.  Updated Vital Signs BP (!) 150/98 (BP Location: Right Arm)   Pulse 72   Temp 98.2 F (36.8 C) (Oral)   Resp 16   SpO2 98%   Visual Acuity Right Eye Distance:   Left Eye Distance:   Bilateral Distance:    Right Eye  Near:   Left Eye Near:    Bilateral Near:     Physical Exam Vitals and nursing note reviewed.  Cardiovascular:     Rate and Rhythm: Normal rate and regular rhythm.     Heart sounds: Normal heart sounds.  Pulmonary:     Effort: Pulmonary effort is normal.     Breath sounds: Normal breath sounds.  Abdominal:     General: Bowel sounds are normal.     Palpations: Abdomen is soft.  Musculoskeletal:     Cervical back: Normal range of motion.  Neurological:     Mental Status: He is alert.      UC Treatments / Results  Labs (all labs ordered are listed, but only abnormal results are displayed) Labs Reviewed - No data to display  EKG   Radiology No results found.  Procedures Procedures (including critical care time)  Medications Ordered in UC Medications - No data to display  Initial Impression / Assessment and Plan / UC Course  I have reviewed the triage vital signs and the nursing notes.  Pertinent labs & imaging results that were  available during my care of the patient were reviewed by me and considered in my medical decision making (see chart for details).     1.  Viral gastroenteritis: Zofran ODT as needed for nausea Ibuprofen every 6-8 hours as needed for pain Patient is encouraged to increase oral fluid intake preferably an electrolyte balanced fluid Tylenol or ibuprofen as needed for pain Return precautions given. Final Clinical Impressions(s) / UC Diagnoses   Final diagnoses:  Viral gastroenteritis     Discharge Instructions      Your symptoms is likely secondary to a viral illness Increase oral fluid intake Take medications as recommended No indication for viral testing at this time Please return to urgent care if you have worsening symptoms.   ED Prescriptions     Medication Sig Dispense Auth. Provider   ondansetron (ZOFRAN-ODT) 4 MG disintegrating tablet Take 1 tablet (4 mg total) by mouth every 8 (eight) hours as needed for nausea or vomiting. 20 tablet Raeana Blinn, Britta Mccreedy, MD   ibuprofen (ADVIL) 600 MG tablet Take 1 tablet (600 mg total) by mouth every 6 (six) hours as needed. 30 tablet Jacqulynn Shappell, Britta Mccreedy, MD      PDMP not reviewed this encounter.   Merrilee Jansky, MD 11/11/22 [phone]

## 2022-11-11 NOTE — Discharge Instructions (Addendum)
Your symptoms is likely secondary to a viral illness Increase oral fluid intake Take medications as recommended No indication for viral testing at this time Please return to urgent care if you have worsening symptoms.

## 2022-11-11 NOTE — ED Triage Notes (Signed)
Pt reports headache started on Thursday. Reports started having diarrhea, nausea and chills yesterday. Took ibuprofen for headache

## 2022-11-13 ENCOUNTER — Ambulatory Visit (INDEPENDENT_AMBULATORY_CARE_PROVIDER_SITE_OTHER): Payer: No Typology Code available for payment source | Admitting: Family Medicine

## 2022-11-13 ENCOUNTER — Encounter: Payer: Self-pay | Admitting: Family Medicine

## 2022-11-13 VITALS — BP 120/80 | HR 70 | Temp 98.7°F | Resp 12 | Ht 72.0 in | Wt 204.2 lb

## 2022-11-13 DIAGNOSIS — R110 Nausea: Secondary | ICD-10-CM

## 2022-11-13 DIAGNOSIS — R7303 Prediabetes: Secondary | ICD-10-CM

## 2022-11-13 DIAGNOSIS — R197 Diarrhea, unspecified: Secondary | ICD-10-CM | POA: Diagnosis not present

## 2022-11-13 DIAGNOSIS — R509 Fever, unspecified: Secondary | ICD-10-CM | POA: Diagnosis not present

## 2022-11-13 DIAGNOSIS — B349 Viral infection, unspecified: Secondary | ICD-10-CM | POA: Diagnosis not present

## 2022-11-13 LAB — BASIC METABOLIC PANEL
BUN: 7 mg/dL (ref 6–23)
CO2: 30 mEq/L (ref 19–32)
Calcium: 8.9 mg/dL (ref 8.4–10.5)
Chloride: 104 mEq/L (ref 96–112)
Creatinine, Ser: 0.88 mg/dL (ref 0.40–1.50)
GFR: 105.71 mL/min (ref >60.00)
Glucose, Bld: 108 mg/dL — ABNORMAL HIGH (ref 70–99)
Potassium: 4.2 mEq/L (ref 3.5–5.1)
Sodium: 141 mEq/L (ref 135–145)

## 2022-11-13 LAB — POCT INFLUENZA A/B
Influenza A, POC: NEGATIVE
Influenza B, POC: NEGATIVE

## 2022-11-13 LAB — CBC
HCT: 42.7 % (ref 39.0–52.0)
Hemoglobin: 14 g/dL (ref 13.0–17.0)
MCHC: 32.8 g/dL (ref 30.0–36.0)
MCV: 81.6 fl (ref 78.0–100.0)
Platelets: 293 10*3/uL (ref 150.0–400.0)
RBC: 5.23 Mil/uL (ref 4.22–5.81)
RDW: 13.8 % (ref 11.5–15.5)
WBC: 4.1 10*3/uL (ref 4.0–10.5)

## 2022-11-13 LAB — HEMOGLOBIN A1C: Hgb A1c MFr Bld: 6.4 % (ref 4.6–6.5)

## 2022-11-13 LAB — POC COVID19 BINAXNOW: SARS Coronavirus 2 Ag: NEGATIVE

## 2022-11-13 MED ORDER — FLUTICASONE PROPIONATE 50 MCG/ACT NA SUSP
1.0000 | Freq: Two times a day (BID) | NASAL | 0 refills | Status: AC
Start: 2022-11-13 — End: ?

## 2022-11-13 MED ORDER — ONDANSETRON HCL 4 MG PO TABS: 4.0000 mg | ORAL_TABLET | Freq: Two times a day (BID) | ORAL | 0 refills | Status: AC | PRN

## 2022-11-13 NOTE — Progress Notes (Signed)
ACUTE VISIT Chief Complaint  Patient presents with   Headache   Fever   HPI: [name] is a 42 y.o. male with PMHx significant for chronic back pain and prediabetes here today complaining of 6 days of frontal pressure headache and 3 to 4 days of fever and respiratory symptoms. He reports the onset of headache on Thursday last week, followed by fever, body aches, chills, and diarrhea on Saturday. The highest recorded fever was 100.41F, and the patient has not experienced fever since Saturday.  Fever  This is a new problem. The current episode started in the past 7 days. The problem occurs intermittently. The problem has been resolved. The maximum temperature noted was 102 to 102.9 F. The temperature was taken using an oral thermometer. Associated symptoms include congestion, coughing, diarrhea, headaches, muscle aches, nausea and a sore throat. Pertinent negatives include no abdominal pain, chest pain, ear pain, rash, sleepiness, urinary pain or vomiting. He has tried NSAIDs for the symptoms. The treatment provided moderate relief.  Risk factors: no contaminated food, no contaminated water, no hx of cancer, no immunosuppression, no occupational exposure, no recent sickness and no sick contacts    He describes experiencing nasal congestion mild throat pain, and difficulty breathing through his nose at night.  He also reports diarrhea, with one episode this morning and four to five episodes yesterday, initially watery but becoming more solid today. He denies any blood in the stool or melena.  His wife recently began experiencing symptoms, but he was the first to show signs of illness.  There has been no recent travel or suspicious food consumption reported.   Negative for decreased urine output. He has experienced nausea without vomiting. Reporting improvement in appetite.  He was last seen on 08/05/2022, when hemoglobin A1c was mildly elevated. Negative for polydipsia, polyuria,  polyphagia.  Lab Results  Component Value Date   HGBA1C 6.5 06/04/2022  Negative for polydipsia,polyuria, or polyphagia.  Review of Systems  Constitutional:  Positive for fever.  HENT:  Positive for congestion, postnasal drip and sore throat. Negative for ear pain, mouth sores and rhinorrhea.   Respiratory:  Positive for cough.   Cardiovascular:  Negative for chest pain, palpitations and leg swelling.  Gastrointestinal:  Positive for diarrhea and nausea. Negative for abdominal pain and vomiting.  Genitourinary:  Negative for dysuria and hematuria.  Skin:  Negative for rash.  Neurological:  Positive for headaches.  See other pertinent positives and negatives in HPI.  Current Outpatient Medications on File Prior to Visit  Medication Sig Dispense Refill   ibuprofen (ADVIL) 600 MG tablet Take 1 tablet (600 mg total) by mouth every 6 (six) hours as needed. 30 tablet 0   No current facility-administered medications on file prior to visit.   Past Medical History:  Diagnosis Date   Tobacco abuse disorder    No Known Allergies  Social History   Socioeconomic History   Marital status: Married    Spouse name: Not on file   Number of children: Not on file   Years of education: Not on file   Highest education level: Some college, no degree  Occupational History   Not on file  Tobacco Use   Smoking status: Every Day    Types: Cigarettes   Smokeless tobacco: Never  Vaping Use   Vaping Use: Never used  Substance and Sexual Activity   Alcohol use: Yes   Drug use: Yes    Types: Marijuana   Sexual activity: Not on  file  Other Topics Concern   Not on file  Social History Narrative   Not on file   Social Determinants of Health   Financial Resource Strain: Low Risk  (11/13/2022)   Overall Financial Resource Strain (CARDIA)    Difficulty of Paying Living Expenses: Not very hard  Food Insecurity: No Food Insecurity (11/13/2022)   Hunger Vital Sign    Worried About Running Out of  Food in the Last Year: Never true    Ran Out of Food in the Last Year: Never true  Transportation Needs: No Transportation Needs (11/13/2022)   PRAPARE - Administrator, Civil Service (Medical): No    Lack of Transportation (Non-Medical): No  Physical Activity: Inactive (11/13/2022)   Exercise Vital Sign    Days of Exercise per Week: 2 days    Minutes of Exercise per Session: 0 min  Stress: Stress Concern Present (11/13/2022)   Edward Blackburn [employer] - Occupational Stress Questionnaire    Feeling of Stress : To some extent  Social Connections: Moderately Integrated (11/13/2022)   Social Connection and Isolation Panel [NHANES]    Frequency of Communication with Friends and Family: More than three times a week    Frequency of Social Gatherings with Friends and Family: Three times a week    Attends Religious Services: More than 4 times per year    Active Member of Clubs or Organizations: No    Attends Banker Meetings: Not on file    Marital Status: Married   Vitals:   11/13/22 0935  BP: 120/80  Pulse: 70  Resp: 12  Temp: 98.7 F (37.1 C)  SpO2: 98%   Body mass index is 27.7 kg/m.  Physical Exam Vitals and nursing note reviewed.  Constitutional:      General: He is not in acute distress.    Appearance: He is well-developed. He is not ill-appearing.  HENT:     Head: Normocephalic and atraumatic.     Right Ear: External ear normal.     Left Ear: External ear normal.     Ears:     Comments: Cerumen excess bilateral, cannot see TM's.    Nose: Congestion and rhinorrhea present.     Right Turbinates: Enlarged.     Left Turbinates: Enlarged.     Right Sinus: No maxillary sinus tenderness or frontal sinus tenderness.     Left Sinus: No maxillary sinus tenderness or frontal sinus tenderness.     Mouth/Throat:     Mouth: Mucous membranes are moist.     Pharynx: Oropharynx is clear.  Eyes:     Conjunctiva/sclera: Conjunctivae normal.   Neck:     Meningeal: Brudzinski's sign and Kernig's sign absent.  Cardiovascular:     Rate and Rhythm: Normal rate and regular rhythm.     Heart sounds: No murmur heard. Pulmonary:     Effort: Pulmonary effort is normal. No respiratory distress.     Breath sounds: Normal breath sounds. No stridor.  Abdominal:     General: Bowel sounds are normal.     Palpations: Abdomen is soft. There is no hepatomegaly or mass.     Tenderness: There is no abdominal tenderness.  Musculoskeletal:     Cervical back: No edema or erythema. No muscular tenderness.  Lymphadenopathy:     Cervical: No cervical adenopathy.  Skin:    General: Skin is warm.     Findings: No erythema or rash.  Neurological:     General: No  focal deficit present.     Mental Status: He is alert and oriented to person, place, and time.     Cranial Nerves: No cranial nerve deficit.     Gait: Gait normal.  Psychiatric:        Mood and Affect: Mood and affect normal.   ASSESSMENT AND PLAN:  Edward Blackburn was seen today for respiratory symptoms and diarrhea. Lab Results  Component Value Date   HGBA1C 6.4 11/13/2022   Lab Results  Component Value Date   CREATININE 0.88 11/13/2022   BUN 7 11/13/2022   NA 141 11/13/2022   K 4.2 11/13/2022   CL 104 11/13/2022   CO2 30 11/13/2022   Lab Results  Component Value Date   WBC 4.1 11/13/2022   HGB 14.0 11/13/2022   HCT 42.7 11/13/2022   MCV 81.6 11/13/2022   PLT 293.0 11/13/2022   Fever, unspecified fever cause -     POC COVID-19 BinaxNow -     POCT Influenza A/B -     CBC; Future  Acute viral syndrome Rapid flu and COVID 19 test here in the office negative. Reporting gradual improvement. Instructed to monitor for signs of complications, including new onset of fever among some, clearly instructed about warning signs. Flonase nasal spray recommended to help with nasal congestion. Tylenol 500 mg 3-4 times per day as needed. F/U as needed.  -     Fluticasone Propionate;  Place 1 spray into both nostrils 2 (two) times daily.  Dispense: 16 g; Refill: 0  Diarrhea, unspecified type Most likely related with above problem. Adequate hydration and advance diet as tolerated. I do not think further work up is needed at this time. Instructed about warning signs.  -     CBC; Future -     Basic metabolic panel; Future  Nausea without vomiting Recommend small sips of fluids and small portions of food at the time. Zofran 4 mg bid prn.  -     Ondansetron HCl; Take 1 tablet (4 mg total) by mouth every 12 (twelve) hours as needed for up to 3 days for nausea or vomiting.  Dispense: 6 tablet; Refill: 0  Prediabetes HgA1C 6.5 in 07/2022 and FG was 99. A healthy life style encouraged for diabetes prevention. Further recommendations according to HgA1C result.  -     Hemoglobin A1c; Future  Return if symptoms worsen or fail to improve, for keep next appointment.  Cherylene Ferrufino G. [place], MD  [facility]. [place] office.

## 2022-11-13 NOTE — Patient Instructions (Addendum)
A few things to remember from today's visit:  Fever, unspecified fever cause - Plan: POC COVID-19, POC Influenza A/B, CBC  Diarrhea, unspecified type - Plan: CBC, Basic metabolic panel  Prediabetes - Plan: Hemoglobin A1c  Nausea without vomiting - Plan: ondansetron (ZOFRAN) 4 MG tablet  Acute viral syndrome  Flonase nasal spray daily at bedtime for 10-14 days then as needed. Saline nasal irrigations as needed.  If you need refills for medications you take chronically, please call your pharmacy. Do not use My Chart to request refills or for acute issues that need immediate attention. If you send a my chart message, it may take a few days to be addressed, specially if I am not in the office.  Please be sure medication list is accurate. If a new problem present, please set up appointment sooner than planned today.

## 2023-01-21 NOTE — Progress Notes (Signed)
ACUTE VISIT Chief Complaint  Patient presents with   Shoulder Pain    Right shoulder x 2 months, limited movement; also noticed a knot on it as well.    Rash    Inner thigh x a few months, itchy.    HPI: Edward Blackburn. is a 42 y.o. male, who is here today complaining of right shoulder pain that has persisted for the past two months. The pain is described as a constant, steady ache, with sharp pain occurring upon reaching or with certain movements. He denies any history of overuse or trauma to the shoulder. Now pain is 3/10 but with movement can be 8-10/10.  Shoulder Pain  The pain is present in the right shoulder. This is a new problem. The current episode started more than 1 month ago. There has been no history of extremity trauma. The problem occurs constantly. The problem has been unchanged. The pain is moderate. Pertinent negatives include no fever, inability to bear weight, joint locking, joint swelling, limited range of motion or stiffness. The symptoms are aggravated by activity. He has tried nothing for the symptoms. Family history does not include gout or rheumatoid arthritis. His past medical history is significant for osteoarthritis (chronic back pain). There is no history of diabetes or rheumatoid arthritis.   There is occasional tingling radiating down to the pinky finger. The intensity of the pain has remained consistent since its onset two months ago.  Additionally, he reports the presence of a rash on groin area that has been present for a few months. The rash is described as pruritic and hyperpigmented, not erythematous.  He notes that the pruritus seems to be less severe when wearing cotton boxers.  No associated fever or chills are reported in conjunction with the rash. He has not tried OTC treatment.  He  also mentions a previous knot on the left rib that has since disappeared, noted residual hypopigmented area.   He has a history of depression but reports  improvement in their mental health condition, denies symptoms.  Review of Systems  Constitutional:  Negative for activity change, appetite change, chills and fever.  HENT:  Negative for mouth sores and sore throat.   Respiratory:  Negative for cough, shortness of breath and wheezing.   Cardiovascular:  Negative for chest pain and palpitations.  Gastrointestinal:  Negative for abdominal pain, nausea and vomiting.  Musculoskeletal:  Negative for stiffness.  Neurological:  Negative for syncope, weakness and headaches.  See other pertinent positives and negatives in HPI.  Current Outpatient Medications on File Prior to Visit  Medication Sig Dispense Refill   fluticasone (FLONASE) 50 MCG/ACT nasal spray Place 1 spray into both nostrils 2 (two) times daily. 16 g 0   ibuprofen (ADVIL) 600 MG tablet Take 1 tablet (600 mg total) by mouth every 6 (six) hours as needed. 30 tablet 0   No current facility-administered medications on file prior to visit.   Past Medical History:  Diagnosis Date   Tobacco abuse disorder    No Known Allergies  Social History   Socioeconomic History   Marital status: Married    Spouse name: Not on file   Number of children: Not on file   Years of education: Not on file   Highest education level: Some college, no degree  Occupational History   Not on file  Tobacco Use   Smoking status: Every Day    Types: Cigarettes   Smokeless tobacco: Never  Vaping Use   Vaping  Use: Never used  Substance and Sexual Activity   Alcohol use: Yes   Drug use: Yes    Types: Marijuana   Sexual activity: Not on file  Other Topics Concern   Not on file  Social History Narrative   Not on file   Social Determinants of Health   Financial Resource Strain: Low Risk  (11/13/2022)   Overall Financial Resource Strain (CARDIA)    Difficulty of Paying Living Expenses: Not very hard  Food Insecurity: No Food Insecurity (11/13/2022)   Hunger Vital Sign    Worried About Running Out  of Food in the Last Year: Never true    Ran Out of Food in the Last Year: Never true  Transportation Needs: No Transportation Needs (11/13/2022)   PRAPARE - Administrator, Civil Service (Medical): No    Lack of Transportation (Non-Medical): No  Physical Activity: Inactive (11/13/2022)   Exercise Vital Sign    Days of Exercise per Week: 2 days    Minutes of Exercise per Session: 0 min  Stress: Stress Concern Present (11/13/2022)   [employer] of Occupational Health - Occupational Stress Questionnaire    Feeling of Stress : To some extent  Social Connections: Moderately Integrated (11/13/2022)   Social Connection and Isolation Panel [NHANES]    Frequency of Communication with Friends and Family: More than three times a week    Frequency of Social Gatherings with Friends and Family: Three times a week    Attends Religious Services: More than 4 times per year    Active Member of Clubs or Organizations: No    Attends Banker Meetings: Not on file    Marital Status: Married   Vitals:   01/22/23 1016  BP: 128/80  Pulse: 64  Resp: 12  Temp: 98.8 F (37.1 C)  SpO2: 98%   Body mass index is 27.43 kg/m.  Physical Exam Vitals and nursing note reviewed.  Constitutional:      General: He is not in acute distress.    Appearance: He is well-developed.  HENT:     Head: Normocephalic and atraumatic.  Eyes:     Conjunctiva/sclera: Conjunctivae normal.  Neck:     Trachea: No tracheal deviation.  Cardiovascular:     Rate and Rhythm: Normal rate and regular rhythm.     Heart sounds: No murmur heard. Pulmonary:     Effort: Pulmonary effort is normal. No respiratory distress.     Breath sounds: Normal breath sounds.  Abdominal:     Palpations: Abdomen is soft. There is no mass.     Tenderness: There is no abdominal tenderness.  Musculoskeletal:     Right shoulder: Tenderness present. No bony tenderness or crepitus. Normal range of motion.     Comments: Right  Shoulder: No deformity, edema, or erythema appreciated.No muscle atrophy. Pain upon palpation of anterior aspect of shoulder and exacerbated with abduction. Juanetta Gosling' test neg, drop arm rotator cuff test neg, empty can supraspinatus test neg, lift-Off Subscapularis test elicits mild pain.  Lymphadenopathy:     Cervical: No cervical adenopathy.  Skin:    General: Skin is warm.     Findings: Rash present. No erythema.       Neurological:     General: No focal deficit present.     Mental Status: He is alert and oriented to person, place, and time.  Psychiatric:        Mood and Affect: Mood and affect normal.   ASSESSMENT AND PLAN:  Edward Blackburn was seen today for skin rash and right shoulder pain.  Right shoulder pain, unspecified chronicity Examination today does not suggest a serious process. ? Supraspinatus tendinitis. We discussed options, PT vs referral to specialist, he prefers the latter one. I do not think imaging is needed at this time.  -     Ambulatory referral to Sports Medicine  [facility] We discussed Dx, prognosis,and treatment options as well as differential Dx's. Keep area dry, wear cotton underwear,and use antibacterial soap. Clotrimazole-Betamethasone cream bid prn for up to 14 days at the time and small amount. OTC Lotrimin powder daily may also help.  -     Clotrimazole-Betamethasone; Bid, small amount on groin area for up to 14 days then as needed.  Dispense: 45 g; Refill: 1  Hypopigmented skin lesion Small on left rib cage. It does not seem suspicious for a serious process. Monitor for changes and if it increases in size of new lesion appear dermatology referral will be recommended.  Return if symptoms worsen or fail to improve.  Cathleen Yagi G. [place], MD  [facility]. [place] office.

## 2023-01-22 ENCOUNTER — Encounter: Payer: Self-pay | Admitting: Family Medicine

## 2023-01-22 ENCOUNTER — Ambulatory Visit (INDEPENDENT_AMBULATORY_CARE_PROVIDER_SITE_OTHER): Payer: No Typology Code available for payment source | Admitting: Family Medicine

## 2023-01-22 VITALS — BP 128/80 | HR 64 | Temp 98.8°F | Resp 12 | Ht 72.0 in | Wt 202.2 lb

## 2023-01-22 DIAGNOSIS — M25511 Pain in right shoulder: Secondary | ICD-10-CM | POA: Diagnosis not present

## 2023-01-22 DIAGNOSIS — L819 Disorder of pigmentation, unspecified: Secondary | ICD-10-CM

## 2023-01-22 DIAGNOSIS — L304 Erythema intertrigo: Secondary | ICD-10-CM

## 2023-01-22 MED ORDER — CLOTRIMAZOLE-BETAMETHASONE 1-0.05 % EX CREA
TOPICAL_CREAM | CUTANEOUS | 1 refills | Status: DC
Start: 2023-01-22 — End: 2023-09-17

## 2023-01-22 NOTE — Patient Instructions (Addendum)
A few things to remember from today's visit:  Right shoulder pain, unspecified chronicity - Plan: Ambulatory referral to Sports Medicine  [facility] - Plan: clotrimazole-betamethasone (LOTRISONE) cream  Hypopigmented skin lesion Small amount of cream on groin. Over the counter Lotrimin powder to keep it dry. Cotton underwear. Antibacterial soap. Continue monitoring skin lesion on chest.  If you need refills for medications you take chronically, please call your pharmacy. Do not use My Chart to request refills or for acute issues that need immediate attention. If you send a my chart message, it may take a few days to be addressed, specially if I am not in the office.  Please be sure medication list is accurate. If a new problem present, please set up appointment sooner than planned today.

## 2023-02-04 NOTE — Progress Notes (Unsigned)
   Edward Payor, PhD, LAT, ATC acting as a scribe for [name], MD.  Edward Rankins Sr. is a 42 y.o. male who presents to [facility] at [facility] today for R shoulder pain   Pertinent review of systems: ***  Relevant historical information: ***   Exam:  There were no vitals taken for this visit. General: Well Developed, well nourished, and in no acute distress.   MSK: ***    Lab and Radiology Results No results found for this or any previous visit (from the past 72 hour(s)). No results found.     Assessment and Plan: 42 y.o. male with ***   PDMP not reviewed this encounter. No orders of the defined types were placed in this encounter.  No orders of the defined types were placed in this encounter.    Discussed warning signs or symptoms. Please see discharge instructions. Patient expresses understanding.   ***

## 2023-02-05 ENCOUNTER — Ambulatory Visit (INDEPENDENT_AMBULATORY_CARE_PROVIDER_SITE_OTHER): Payer: No Typology Code available for payment source

## 2023-02-05 ENCOUNTER — Ambulatory Visit: Payer: No Typology Code available for payment source | Admitting: Family Medicine

## 2023-02-05 ENCOUNTER — Encounter: Payer: Self-pay | Admitting: Family Medicine

## 2023-02-05 ENCOUNTER — Other Ambulatory Visit: Payer: Self-pay

## 2023-02-05 VITALS — BP 132/84 | HR 82 | Ht 72.0 in | Wt 198.7 lb

## 2023-02-05 DIAGNOSIS — G5621 Lesion of ulnar nerve, right upper limb: Secondary | ICD-10-CM

## 2023-02-05 DIAGNOSIS — G8929 Other chronic pain: Secondary | ICD-10-CM

## 2023-02-05 DIAGNOSIS — M25511 Pain in right shoulder: Secondary | ICD-10-CM | POA: Diagnosis not present

## 2023-02-05 NOTE — Patient Instructions (Addendum)
Thank you for coming in today.  Please get an Xray today before you leave  Check for a Cubital Tunnel Brace  Referral placed for Physical Therapy with [facility]

## 2023-02-14 NOTE — Progress Notes (Signed)
Right shoulder x-ray shows some mild arthritis

## 2023-03-18 NOTE — Progress Notes (Unsigned)
   [name], PhD, LAT, ATC acting as a scribe for [name], MD.  Edward Rankins Sr. is a 42 y.o. male who presents to [facility] at [facility] today for f/u R shoulder pain. RHD. Pt was last seen by Dr. Denyse Amass on 02/05/23 and was referred to [facility], which was OON. Referral was switched to [facility].   Today, pt reports ***   Dx imaging: 02/05/23 R shoulder XR 09/05/21 C-spine XR   Pertinent review of systems: ***  Relevant historical information: ***   Exam:  There were no vitals taken for this visit. General: Well Developed, well nourished, and in no acute distress.   MSK: ***    Lab and Radiology Results No results found for this or any previous visit (from the past 72 hour(s)). No results found.     Assessment and Plan: 42 y.o. male with ***   PDMP not reviewed this encounter. No orders of the defined types were placed in this encounter.  No orders of the defined types were placed in this encounter.    Discussed warning signs or symptoms. Please see discharge instructions. Patient expresses understanding.   ***

## 2023-03-19 ENCOUNTER — Encounter: Payer: Self-pay | Admitting: Family Medicine

## 2023-03-19 ENCOUNTER — Ambulatory Visit (INDEPENDENT_AMBULATORY_CARE_PROVIDER_SITE_OTHER): Payer: No Typology Code available for payment source | Admitting: Family Medicine

## 2023-03-19 ENCOUNTER — Other Ambulatory Visit: Payer: Self-pay

## 2023-03-19 VITALS — BP 122/80 | HR 79 | Ht 72.0 in | Wt 197.0 lb

## 2023-03-19 DIAGNOSIS — G8929 Other chronic pain: Secondary | ICD-10-CM | POA: Diagnosis not present

## 2023-03-19 DIAGNOSIS — M25511 Pain in right shoulder: Secondary | ICD-10-CM | POA: Diagnosis not present

## 2023-03-19 NOTE — Patient Instructions (Addendum)
Thank you for coming in today.  You received an injection today. Seek immediate medical attention if the joint becomes red, extremely painful, or is oozing fluid.  Give it another month or two.  If not better enough let me know and we can do an injection.   We could do an MRI but holding off on that for makes sense.

## 2023-03-21 ENCOUNTER — Encounter: Payer: Self-pay | Admitting: Family Medicine

## 2023-04-11 ENCOUNTER — Other Ambulatory Visit (HOSPITAL_COMMUNITY)
Admission: RE | Admit: 2023-04-11 | Discharge: 2023-04-11 | Disposition: A | Discharge status: Home / Self Care | Payer: No Typology Code available for payment source | Source: Ambulatory Visit | Attending: Family Medicine | Admitting: Family Medicine

## 2023-04-11 ENCOUNTER — Encounter: Payer: Self-pay | Admitting: Family Medicine

## 2023-04-11 ENCOUNTER — Ambulatory Visit (INDEPENDENT_AMBULATORY_CARE_PROVIDER_SITE_OTHER): Payer: No Typology Code available for payment source | Admitting: Family Medicine

## 2023-04-11 VITALS — BP 120/80 | HR 82 | Temp 98.8°F | Resp 12 | Ht 72.0 in | Wt 198.4 lb

## 2023-04-11 DIAGNOSIS — Z113 Encounter for screening for infections with a predominantly sexual mode of transmission: Secondary | ICD-10-CM

## 2023-04-11 DIAGNOSIS — R369 Urethral discharge, unspecified: Secondary | ICD-10-CM | POA: Diagnosis not present

## 2023-04-11 DIAGNOSIS — R7303 Prediabetes: Secondary | ICD-10-CM

## 2023-04-11 DIAGNOSIS — L281 Prurigo nodularis: Secondary | ICD-10-CM | POA: Diagnosis not present

## 2023-04-11 LAB — HEMOGLOBIN A1C: Hgb A1c MFr Bld: 6.4 % (ref 4.6–6.5)

## 2023-04-11 MED ORDER — NYSTATIN-TRIAMCINOLONE 100000-0.1 UNIT/GM-% EX CREA
1.0000 | TOPICAL_CREAM | Freq: Two times a day (BID) | CUTANEOUS | 1 refills | Status: DC | PRN
Start: 2023-04-11 — End: 2023-09-17

## 2023-04-11 NOTE — Patient Instructions (Addendum)
A few things to remember from today's visit:  Urethral discharge - Plan: Urine cytology ancillary only  Screen for STD (sexually transmitted disease) - Plan: RPR, HIV Antibody (routine testing w rflx)  Prurigo nodularis - Plan: nystatin-triamcinolone (MYCOLOG II) cream  Itchy lesions on skin could be prurigo nodularis, topical steroid, small amount 2 times daily as needed recommended. Avoid scratching lesions.  If you need refills for medications you take chronically, please call your pharmacy. Do not use My Chart to request refills or for acute issues that need immediate attention. If you send a my chart message, it may take a few days to be addressed, specially if I am not in the office.  Please be sure medication list is accurate. If a new problem present, please set up appointment sooner than planned today.

## 2023-04-11 NOTE — Progress Notes (Signed)
ACUTE VISIT Chief Complaint  Patient presents with   Foot Injury    Stepped on a rusty nail, nail went through shoe & into foot. Tdap utd - 10/05/19. Left foot- happened on Sunday.    HPI: Edward Blackburn. is a 42 y.o. male with a PMHx significant for Pre DM and chronic back pain here today complaining of stepping on a rusty nail as described above.  He reports he stepped on a rusty nail on 04/06/23 outside of his house that he states pierced his shoe and punctured his foot. He went inside and washed it with peroxide, which he says did not cause a burning sensation.  He denies pain or bleeding after incident. He denies any plantar pain, edema,or erythema.  He is up to date on his tetanus vaccines.   Immunization History  Administered Date(s) Administered   Tdap 10/05/2019   Penile discharge: States he noticed a "creamy brown" penile discharge Wednesday.  He denies pain on urination, testicular pain, or genital lesions He reports he has two sexual partners, and has not been using condoms for sex. He states he has a history of chlamydia and gonorrhea, but is not aware of any recent exposure.   He reports he has "spots" on his skin on the left arm and left leg that have been very pruritic. He is not sure for how long he has had this problem. Some hyperpigmented lesions on RUE and RLU, which where initially pruritic have improved. No known hx of insect bits or trauma.  Hx of prediabetes, his HgA1C has been 6.5 once.  Lab Results  Component Value Date   HGBA1C 6.4 11/13/2022   Review of Systems  Constitutional:  Negative for activity change, appetite change and fever.  HENT:  Negative for mouth sores and sore throat.   Respiratory:  Negative for cough, shortness of breath and wheezing.   Gastrointestinal:  Negative for abdominal pain, nausea and vomiting.  Endocrine: Negative for polydipsia, polyphagia and polyuria.  Genitourinary:  Negative for decreased urine volume,  dysuria, frequency, hematuria, penile pain and penile swelling.  Musculoskeletal:  Negative for joint swelling and myalgias.  Skin:  Positive for rash. Negative for wound.  Neurological:  Negative for numbness.  Hematological:  Negative for adenopathy. Does not bruise/bleed easily.  See other pertinent positives and negatives in HPI.  Current Outpatient Medications on File Prior to Visit  Medication Sig Dispense Refill   clotrimazole-betamethasone (LOTRISONE) cream Bid, small amount on groin area for up to 14 days then as needed. 45 g 1   fluticasone (FLONASE) 50 MCG/ACT nasal spray Place 1 spray into both nostrils 2 (two) times daily. 16 g 0   No current facility-administered medications on file prior to visit.   Past Medical History:  Diagnosis Date   Tobacco abuse disorder    No Known Allergies  Social History   Socioeconomic History   Marital status: Married    Spouse name: Not on file   Number of children: Not on file   Years of education: Not on file   Highest education level: Some college, no degree  Occupational History   Not on file  Tobacco Use   Smoking status: Every Day    Types: Cigarettes   Smokeless tobacco: Never  Vaping Use   Vaping status: Never Used  Substance and Sexual Activity   Alcohol use: Yes   Drug use: Yes    Types: Marijuana   Sexual activity: Not on file  Other Topics  Concern   Not on file  Social History Narrative   Not on file   Social Determinants of Health   Financial Resource Strain: Low Risk  (11/13/2022)   Overall Financial Resource Strain (CARDIA)    Difficulty of Paying Living Expenses: Not very hard  Food Insecurity: No Food Insecurity (11/13/2022)   Hunger Vital Sign    Worried About Running Out of Food in the Last Year: Never true    Ran Out of Food in the Last Year: Never true  Transportation Needs: No Transportation Needs (11/13/2022)   PRAPARE - Administrator, Civil Service (Medical): No    Lack of  Transportation (Non-Medical): No  Physical Activity: Inactive (11/13/2022)   Exercise Vital Sign    Days of Exercise per Week: 2 days    Minutes of Exercise per Session: 0 min  Stress: Stress Concern Present (11/13/2022)   [employer] of Occupational Health - Occupational Stress Questionnaire    Feeling of Stress : To some extent  Social Connections: Moderately Integrated (11/13/2022)   Social Connection and Isolation Panel [NHANES]    Frequency of Communication with Friends and Family: More than three times a week    Frequency of Social Gatherings with Friends and Family: Three times a week    Attends Religious Services: More than 4 times per year    Active Member of Clubs or Organizations: No    Attends Banker Meetings: Not on file    Marital Status: Married    Vitals:   04/11/23 1209  BP: 120/80  Pulse: 82  Resp: 12  Temp: 98.8 F (37.1 C)  SpO2: 98%   Body mass index is 26.9 kg/m.  Physical Exam Vitals and nursing note reviewed.  Constitutional:      General: He is not in acute distress.    Appearance: He is well-developed.  HENT:     Head: Normocephalic and atraumatic.     Mouth/Throat:     Mouth: Mucous membranes are moist.     Pharynx: Oropharynx is clear.  Eyes:     Conjunctiva/sclera: Conjunctivae normal.  Cardiovascular:     Rate and Rhythm: Normal rate and regular rhythm.     Heart sounds: No murmur heard. Pulmonary:     Effort: Pulmonary effort is normal. No respiratory distress.     Breath sounds: Normal breath sounds.  Musculoskeletal:     Left foot: Normal range of motion and normal capillary refill. No swelling or tenderness. Normal pulse.  Skin:    General: Skin is warm.     Findings: No erythema or rash.     Comments: Left arm and distal LE (under knee) hyperpigmented papular lesion with clear crust, not tender or erythematous.  Left foot, no wound appreciated.   Neurological:     Mental Status: He is alert and oriented to  person, place, and time.     Cranial Nerves: No cranial nerve deficit.     Gait: Gait normal.  Psychiatric:        Mood and Affect: Mood and affect normal.    ASSESSMENT AND PLAN:  Edward Blackburn was seen today for a recent episode where he stepped on a nail.  Lab Results  Component Value Date   HGBA1C 6.4 04/11/2023   Urethral discharge We discussed possible etiologies. No associated urinary symptoms. Options include empiric treatment for gonorrhea with ceftriaxone, we decided to wait for results and give treatment recommendations according to results. He prefers to collect urine  sample.  -     Urine cytology ancillary only  Screen for STD (sexually transmitted disease) STD prevention discussed. No known recent exposure. Encouraged condom use.  -     RPR; Future -     HIV Antibody (routine testing w rflx); Future  Prurigo nodularis -     Nystatin-Triamcinolone; Apply 1 Application topically 2 (two) times daily as needed.  Dispense: 30 g; Refill: 1  Prediabetes Last HgA1C was 6.4 in 10/2022. Consistency with a healthy life style for diabetes prevention encouraged.  -     Hemoglobin A1c; Future  In regard to stepping on nail, it does not seem like nail penetrated skin. No pain and no wound on examination of left foot.  Return if symptoms worsen or fail to improve, for keep next appointment.  I, Rolla Etienne Wierda, acting as a scribe for Germani Gavilanes [place], MD., have documented all relevant documentation on the behalf of Edward Tippetts [place], MD, as directed by  Edward Naill [place], MD while in the presence of Edward Talton [place], MD.   I, Lakyn Mantione [place], MD, have reviewed all documentation for this visit. The documentation on 04/11/23 for the exam, diagnosis, procedures, and orders are all accurate and complete.  Kareen Hitsman G. [place], MD  [facility]. [place] office.

## 2023-04-15 LAB — URINE CYTOLOGY ANCILLARY ONLY
Chlamydia: NEGATIVE
Comment: NEGATIVE
Comment: NEGATIVE
Comment: NORMAL
Neisseria Gonorrhea: POSITIVE — AB
Trichomonas: NEGATIVE

## 2023-04-15 LAB — RPR: RPR Ser Ql: NONREACTIVE

## 2023-04-15 LAB — HIV ANTIBODY (ROUTINE TESTING W REFLEX): HIV 1&2 Ab, 4th Generation: NONREACTIVE

## 2023-04-16 ENCOUNTER — Encounter: Payer: Self-pay | Admitting: Family Medicine

## 2023-04-16 ENCOUNTER — Ambulatory Visit (INDEPENDENT_AMBULATORY_CARE_PROVIDER_SITE_OTHER): Payer: No Typology Code available for payment source | Admitting: Family Medicine

## 2023-04-16 ENCOUNTER — Other Ambulatory Visit: Payer: Self-pay

## 2023-04-16 VITALS — BP 142/94 | HR 86 | Ht 72.0 in | Wt 198.0 lb

## 2023-04-16 DIAGNOSIS — G8929 Other chronic pain: Secondary | ICD-10-CM

## 2023-04-16 DIAGNOSIS — M25511 Pain in right shoulder: Secondary | ICD-10-CM | POA: Diagnosis not present

## 2023-04-16 MED ORDER — DOXYCYCLINE HYCLATE 100 MG PO TABS: 100.0000 mg | ORAL_TABLET | Freq: Two times a day (BID) | ORAL | 0 refills | Status: AC

## 2023-04-16 NOTE — Progress Notes (Signed)
   I, Stevenson Clinch, CMA acting as a scribe for Edward Graham, MD.  Edward Rankins Sr. is a 42 y.o. male who presents to [facility] at [facility] today for re-eval of RIGHT shoulder pain. Pt was last seen by Dr. Denyse Amass for right shoulder pain on 03/19/23 noting some improving of sx with physical therapy. Notes that sx were, at the time, worse in the mornings which at that point was new. Diagnostic u/s was performed revealing concerns for impingement. Pt was advised to continue PT and HEP and would consider therapeutic injections if sx did not improve.   Today patient reports minimal change in sx wit PT and HEP. Denies new or worsening sx.   Dx imaging: 02/05/23 R shoulder XR 09/05/21 C-spine XR   Pertinent review of systems: No fevers or chills  Relevant historical information: Hypertension   Exam:  BP (!) 144/92   Pulse 86   Ht 6' (1.829 m)   Wt 198 lb (89.8 kg)   SpO2 98%   BMI 26.85 kg/m  General: Well Developed, well nourished, and in no acute distress.   MSK: Right shoulder normal-appearing Motion limited functional internal rotation to lower portion lumbar spine.  Otherwise range of motion is intact.  Strength is intact.    Lab and Radiology Results  EXAM: RIGHT SHOULDER - 2+ VIEW   COMPARISON:  None Available.   FINDINGS: Mild degenerative changes of the acromioclavicular joint are seen. No acute fracture or dislocation is noted. No soft tissue abnormality is seen.   IMPRESSION: Mild degenerative change without acute abnormality.     Electronically Signed   By: Alcide Clever M.D.   On: 02/13/2023 21:37 I, Edward Blackburn, personally (independently) visualized and performed the interpretation of the images attached in this note.   Assessment and Plan: 42 y.o. male with persistently bothersome right shoulder pain for over 10 weeks now.  He has had trials of physical therapy and home exercise program directed by me since at least his first visit July  3.  He has not improved.  We discussed options.  Plan for MRI to further evaluate source of pain and for potential surgical or injection planning. If surgery is a reasonable possibility following MRI reasonable to refer directly to orthopedic surgery.  If injection is the most likely next step happy to have return go over the results and proceed with injection. He would be willing to have surgery in 2024 if needed.  PDMP not reviewed this encounter. Orders Placed This Encounter  Procedures   MR SHOULDER RIGHT WO CONTRAST    Standing Status:   Future    Standing Expiration Date:   04/15/2024    Order Specific Question:   What is the patient's sedation requirement?    Answer:   No Sedation    Order Specific Question:   Does the patient have a pacemaker or implanted devices?    Answer:   No    Order Specific Question:   Preferred imaging location?    Answer:   Licensed convey[facility] (table limit-350lbs)   No orders of the defined types were placed in this encounter.    Discussed warning signs or symptoms. Please see discharge instructions. Patient expresses understanding.   The above documentation has been reviewed and is accurate and complete Edward Blackburn, M.D.

## 2023-04-16 NOTE — Patient Instructions (Addendum)
Thank you for coming in today.  You should hear from MRI scheduling within 1 week. If you do not hear please let me know.    Once we get the results back from the MRI if surgery is a possibility I usually get you an appointment with the right surgeon ASAP.

## 2023-04-17 ENCOUNTER — Ambulatory Visit: Payer: No Typology Code available for payment source

## 2023-04-17 DIAGNOSIS — A549 Gonococcal infection, unspecified: Secondary | ICD-10-CM | POA: Diagnosis not present

## 2023-04-17 MED ORDER — CEFTRIAXONE SODIUM 500 MG IJ SOLR
500.0000 mg | Freq: Once | INTRAMUSCULAR | Status: AC
Start: 2023-04-17 — End: 2023-04-17
  Administered 2023-04-17: 500 mg via INTRAMUSCULAR

## 2023-04-17 NOTE — Progress Notes (Signed)
Per orders of Dr. Casimiro Needle (as Dr [place] is out of the office), injection of Ceftriaxone 500mg  injection given by Johnella Moloney. Patient tolerated injection well.

## 2023-04-22 ENCOUNTER — Other Ambulatory Visit: Payer: No Typology Code available for payment source

## 2023-04-22 DIAGNOSIS — M25511 Pain in right shoulder: Secondary | ICD-10-CM | POA: Diagnosis not present

## 2023-04-22 DIAGNOSIS — G8929 Other chronic pain: Secondary | ICD-10-CM | POA: Diagnosis not present

## 2023-04-30 NOTE — Progress Notes (Signed)
MRI shoulder shows tendinitis without rotator cuff tear or bad arthritis.  This could be helped with an injection.  Recommend return to clinic to go over the results of the MRI and consider cortisone injection.

## 2023-05-06 ENCOUNTER — Other Ambulatory Visit: Payer: Self-pay

## 2023-05-06 ENCOUNTER — Ambulatory Visit: Payer: No Typology Code available for payment source | Admitting: Family Medicine

## 2023-05-06 ENCOUNTER — Encounter: Payer: Self-pay | Admitting: Family Medicine

## 2023-05-06 VITALS — BP 124/88 | HR 74 | Ht 72.0 in | Wt 200.0 lb

## 2023-05-06 DIAGNOSIS — G8929 Other chronic pain: Secondary | ICD-10-CM | POA: Diagnosis not present

## 2023-05-06 DIAGNOSIS — M25511 Pain in right shoulder: Secondary | ICD-10-CM

## 2023-05-06 NOTE — Patient Instructions (Signed)
Thank you for coming in today.   Call or go to the ER if you develop a large red swollen joint with extreme pain or oozing puss.    If this is not the right place to the injection let me know. I can inject the other part of the joint as soon as next week.   Keep working on the home exercises.

## 2023-05-06 NOTE — Progress Notes (Signed)
I, Edward Blackburn, CMA acting as a scribe for Edward Graham, MD.  Edward Edward Sr. is a 42 y.o. male who presents to [facility] today for f/u chronic R shoulder pain w/ MRI review. Pt was last seen by Dr. Denyse Blackburn on 04/16/23 and a MRI was ordered.   Today, pt reports continued pain in the right shoulder. Denies new or worsening sx since last visit. MRI review today. Interested in injection for shoulder sx.   Dx imaging: 04/22/23 R shoulder MRI 02/05/23 R shoulder XR 09/05/21 C-spine XR   Pertinent review of systems: No fevers or chills  Relevant historical information: Chronic back pain   Exam:  BP 124/88   Pulse 74   Ht 6' (1.829 m)   Wt 200 lb (90.7 kg)   SpO2 95%   BMI 27.12 kg/m  General: Well Developed, well nourished, and in no acute distress.   MSK: Right shoulder normal-appearing.  Normal motion.    Lab and Radiology Results  Procedure: Real-time Ultrasound Guided Injection of right shoulder subacromial bursa Device: Philips Affiniti 50G/GE Logiq Images permanently stored and available for review in PACS Verbal informed consent obtained.  Discussed risks and benefits of procedure. Warned about infection, bleeding, hyperglycemia damage to structures among others. Patient expresses understanding and agreement Time-out conducted.   Noted no overlying erythema, induration, or other signs of local infection.   Skin prepped in a sterile fashion.   Local anesthesia: Topical Ethyl chloride.   With sterile technique and under real time ultrasound guidance: 40 mg of Kenalog and 2 mL of Marcaine injected into subacromial bursa. Fluid seen entering the bursa.   Completed without difficulty   Pain immediately resolved suggesting accurate placement of the medication.   Advised to call if fevers/chills, erythema, induration, drainage, or persistent bleeding.   Images permanently stored and available for review in the ultrasound unit.  Impression:  Technically successful ultrasound guided injection.      EXAM: MRI OF THE RIGHT SHOULDER WITHOUT CONTRAST   TECHNIQUE: Multiplanar, multisequence MR imaging of the shoulder was performed. No intravenous contrast was administered.   COMPARISON:  None Available.   FINDINGS: Rotator cuff: Mild tendinosis of the supraspinatus and infraspinatus tendons. Teres minor tendon is intact. Subscapularis tendon is intact.   Muscles: No muscle atrophy or edema. No intramuscular fluid collection or hematoma.   Biceps Long Head: Intraarticular and extraarticular portions of the biceps tendon are intact.   Acromioclavicular Joint: Moderate arthropathy of the acromioclavicular joint. Trace subacromial/subdeltoid bursal fluid.   Glenohumeral Joint: No joint effusion. No chondral defect.   Labrum: Posterosuperior labral degeneration with a small tear.   Bones: No fracture or dislocation. No aggressive osseous lesion.   Other: No fluid collection or hematoma.   IMPRESSION: 1. Mild tendinosis of the supraspinatus and infraspinatus tendons. 2. 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     Electronically Signed   By: Edward Blackburn M.D.   On: 04/30/2023 06:14 I, Edward Blackburn, personally (independently) visualized and performed the interpretation of the images attached in this note.    Assessment and Plan: 42 y.o. male with right shoulder pain.  This is a persistent chronic problem not improving despite physical therapy and home exercise program.  MRI does show tendinopathy of the supraspinatus and infraspinatus tendons as well as a possible posterior superior labrum degeneration.  Plan for subacromial injection today.  If this does not work well enough would move to an intra-articular glenohumeral injection.  He will keep me updated. Continue home exercise program.  PDMP not reviewed this encounter. Orders Placed This Encounter  Procedures   [place] LIMITED JOINT SPACE STRUCTURES  UP RIGHT(NO LINKED CHARGES)    Order Specific Question:   Reason for Exam (SYMPTOM  OR DIAGNOSIS REQUIRED)    Answer:   right shoulder pain    Order Specific Question:   Preferred imaging location?    Answer:   [facility]   No orders of the defined types were placed in this encounter.    Discussed warning signs or symptoms. Please see discharge instructions. Patient expresses understanding.   The above documentation has been reviewed and is accurate and complete Edward Blackburn, M.D.

## 2023-08-26 ENCOUNTER — Other Ambulatory Visit (HOSPITAL_COMMUNITY)
Admission: RE | Admit: 2023-08-26 | Discharge: 2023-08-26 | Disposition: A | Discharge status: Home / Self Care | Payer: No Typology Code available for payment source | Source: Ambulatory Visit | Attending: Family Medicine | Admitting: Family Medicine

## 2023-08-26 ENCOUNTER — Encounter: Payer: Self-pay | Admitting: Family Medicine

## 2023-08-26 ENCOUNTER — Ambulatory Visit: Payer: No Typology Code available for payment source | Admitting: Family Medicine

## 2023-08-26 VITALS — BP 118/80 | HR 90 | Resp 12 | Ht 72.0 in | Wt 199.4 lb

## 2023-08-26 DIAGNOSIS — N341 Nonspecific urethritis: Secondary | ICD-10-CM | POA: Insufficient documentation

## 2023-08-26 DIAGNOSIS — A549 Gonococcal infection, unspecified: Secondary | ICD-10-CM | POA: Insufficient documentation

## 2023-08-26 MED ORDER — CEFTRIAXONE SODIUM 500 MG IJ SOLR
500.0000 mg | Freq: Once | INTRAMUSCULAR | Status: AC
  Administered 2023-08-26: 500 mg via INTRAMUSCULAR

## 2023-08-26 MED ORDER — DOXYCYCLINE HYCLATE 100 MG PO TABS
100.0000 mg | ORAL_TABLET | Freq: Two times a day (BID) | ORAL | 0 refills | Status: AC
Start: 2023-08-26 — End: 2023-09-02

## 2023-08-26 NOTE — Progress Notes (Signed)
 ACUTE VISIT Chief Complaint  Patient presents with   std testing   HPI: [name] is a 43 y.o. male with a PMHx significant for prediabetes, insomnia, tinea pedis of both feet, and chronic back pain, who is here today for STD screening.   Patient complains of penile swelling and drainage. He is also having some burning with urination.  Dysuria  This is a new problem. The current episode started yesterday. The problem occurs intermittently. The problem has been unchanged. The quality of the pain is described as burning. The pain is mild. There has been no fever. He is Sexually active. There is No history of pyelonephritis. Associated symptoms include a discharge. Pertinent negatives include no chills, flank pain, frequency, hematuria, hesitancy, nausea, urgency or vomiting.   He reports the drainage went away with abx  treatment but has returned.  He has two sexual partners, and is wearing condoms with one, but not the one from whom he believes he got this infection.  His sexual partners have not been treated as recommended.  Pertinent negatives include penile lesions, itching, redness, or pain.   Review of Systems  Constitutional:  Negative for activity change, appetite change, chills and fever.  HENT:  Negative for mouth sores and sore throat.   Gastrointestinal:  Negative for abdominal pain, nausea and vomiting.  Genitourinary:  Positive for dysuria. Negative for flank pain, frequency, hematuria, hesitancy and urgency.  Musculoskeletal:  Negative for arthralgias, joint swelling and myalgias.  Skin:  Negative for rash.  Neurological:  Negative for syncope and weakness.  See other pertinent positives and negatives in HPI.  Current Outpatient Medications on File Prior to Visit  Medication Sig Dispense Refill   clotrimazole -betamethasone  (LOTRISONE ) cream Bid, small amount on groin area for up to 14 days then as needed. 45 g 1   fluticasone  (FLONASE ) 50 MCG/ACT nasal spray  Place 1 spray into both nostrils 2 (two) times daily. 16 g 0   nystatin -triamcinolone  (MYCOLOG II) cream Apply 1 Application topically 2 (two) times daily as needed. 30 g 1   No current facility-administered medications on file prior to visit.   Past Medical History:  Diagnosis Date   Tobacco abuse disorder    No Known Allergies  Social History   Socioeconomic History   Marital status: Married    Spouse name: Not on file   Number of children: Not on file   Years of education: Not on file   Highest education level: Some college, no degree  Occupational History   Not on file  Tobacco Use   Smoking status: Every Day    Types: Cigarettes   Smokeless tobacco: Never  Vaping Use   Vaping status: Never Used  Substance and Sexual Activity   Alcohol use: Yes   Drug use: Yes    Types: Marijuana   Sexual activity: Not on file  Other Topics Concern   Not on file  Social History Narrative   Not on file   Social Drivers of Health   Financial Resource Strain: Medium Risk (08/23/2023)   Overall Financial Resource Strain (CARDIA)    Difficulty of Paying Living Expenses: Somewhat hard  Food Insecurity: No Food Insecurity (08/23/2023)   Hunger Vital Sign    Worried About Running Out of Food in the Last Year: Never true    Ran Out of Food in the Last Year: Never true  Transportation Needs: No Transportation Needs (08/23/2023)   PRAPARE - Transportation    Lack of Transportation (  Medical): No    Lack of Transportation (Non-Medical): No  Physical Activity: Inactive (11/13/2022)   Exercise Vital Sign    Days of Exercise per Week: 2 days    Minutes of Exercise per Session: 0 min  Stress: Stress Concern Present (08/23/2023)   [employer] of Occupational Health - Occupational Stress Questionnaire    Feeling of Stress : To some extent  Social Connections: Moderately Integrated (08/23/2023)   Social Connection and Isolation Panel [NHANES]    Frequency of Communication with Friends and  Family: More than three times a week    Frequency of Social Gatherings with Friends and Family: Three times a week    Attends Religious Services: More than 4 times per year    Active Member of Clubs or Organizations: No    Attends Banker Meetings: Not on file    Marital Status: Married    Vitals:   08/26/23 0936  BP: 118/80  Pulse: 90  Resp: 12  SpO2: 97%   Body mass index is 27.04 kg/m.  Physical Exam Vitals and nursing note reviewed. Exam conducted with a chaperone present.  Constitutional:      General: He is not in acute distress.    Appearance: He is well-developed.  HENT:     Head: Normocephalic and atraumatic.  Eyes:     Conjunctiva/sclera: Conjunctivae normal.  Pulmonary:     Effort: Pulmonary effort is normal. No respiratory distress.  Genitourinary:    Penis: Discharge and swelling present. No tenderness or lesions.      Epididymis:     Right: Normal.     Left: Normal.     Comments: Posterior aspect of penis with soft preputial edema, minimally tender and no significant erythema. No induration or mass appreciated. Yellowish thin urethral drainage. Urethral swab collected. Skin:    General: Skin is warm.     Findings: No erythema or rash.  Neurological:     General: No focal deficit present.     Mental Status: He is alert and oriented to person, place, and time.     Gait: Gait normal.  Psychiatric:        Mood and Affect: Affect normal. Mood is anxious.   ASSESSMENT AND PLAN:  Mr. Edward Blackburn was seen today for STD screening.   Urethritis, nonspecific We discussed differential Dx. Given his hx of gonorrhea infection, this is presumed to be STD related, so empiric treatment with Rocephin  500  mg IM here in the office after verbal consent and Doxycycline  recommended. Localized edema on penile distal shalt is not tender, indurated,or erythematous. I am not sure if it is related with this problem. Monitor for changes or new symptoms, if it does not  resolve we may need urology consultation.  -     Doxycycline  Hyclate; Take 1 tablet (100 mg total) by mouth 2 (two) times daily for 7 days.  Dispense: 14 tablet; Refill: 0 -     Cytology (oral, anal, urethral) ancillary only -     Chlamydia/Gonococcus/Trichomonas, NAA; Future  Gonorrhea in male Completed abx treatment, sex partners not treated, suspect reinfection. Urethral swab collected. Treated here in the office with Rocephin  500 mg IM.  We discussed possible complications. Sex pertness must be treated. Encouraged safe sex practices, including condom use every time with sex intercourse.  -     Cytology (oral, anal, urethral) ancillary only -     Chlamydia/Gonococcus/Trichomonas, NAA; Future -     cefTRIAXone  Sodium  Return if symptoms worsen  or fail to improve.  I, Leonce PARAS Wierda, acting as a scribe for Ival Basquez, MD., have documented all relevant documentation on the behalf of Jori Frerichs, MD, as directed by  Jaleigh Mccroskey, MD while in the presence of Costa Jha, MD.   I, Keziah Drotar, MD, have reviewed all documentation for this visit. The documentation on 08/26/23 for the exam, diagnosis, procedures, and orders are all accurate and complete.  Gerrald Basu G. Maryanna Stuber, MD  [facility]. [place] office.

## 2023-08-26 NOTE — Patient Instructions (Addendum)
 A few things to remember from today's visit:  Urethritis, nonspecific  Gonorrhea in male Here in the office he received Rocephin  500 mg intramuscular. Please let sex partner(s), they need to be treated. Always wear condoms. Doxycycline  100 mg sent to your pharmacy to start taking twice daily for 7 days.  If you need refills for medications you take chronically, please call your pharmacy. Do not use My Chart to request refills or for acute issues that need immediate attention. If you send a my chart message, it may take a few days to be addressed, specially if I am not in the office.  Please be sure medication list is accurate. If a new problem present, please set up appointment sooner than planned today.

## 2023-08-27 LAB — CYTOLOGY, (ORAL, ANAL, URETHRAL) ANCILLARY ONLY
Chlamydia: POSITIVE — AB
Comment: NEGATIVE
Comment: NEGATIVE
Comment: NORMAL
Neisseria Gonorrhea: POSITIVE — AB
Trichomonas: NEGATIVE

## 2023-09-17 ENCOUNTER — Encounter: Payer: Self-pay | Admitting: Family Medicine

## 2023-09-17 ENCOUNTER — Ambulatory Visit (INDEPENDENT_AMBULATORY_CARE_PROVIDER_SITE_OTHER): Payer: No Typology Code available for payment source | Admitting: Family Medicine

## 2023-09-17 VITALS — BP 120/70 | HR 81 | Resp 12 | Ht 72.0 in | Wt 201.0 lb

## 2023-09-17 DIAGNOSIS — Z113 Encounter for screening for infections with a predominantly sexual mode of transmission: Secondary | ICD-10-CM

## 2023-09-17 DIAGNOSIS — R7303 Prediabetes: Secondary | ICD-10-CM | POA: Diagnosis not present

## 2023-09-17 DIAGNOSIS — L304 Erythema intertrigo: Secondary | ICD-10-CM | POA: Diagnosis not present

## 2023-09-17 DIAGNOSIS — E785 Hyperlipidemia, unspecified: Secondary | ICD-10-CM | POA: Insufficient documentation

## 2023-09-17 LAB — COMPREHENSIVE METABOLIC PANEL
ALT: 12 U/L (ref 0–53)
AST: 13 U/L (ref 0–37)
Albumin: 4.3 g/dL (ref 3.5–5.2)
Alkaline Phosphatase: 43 U/L (ref 39–117)
BUN: 10 mg/dL (ref 6–23)
CO2: 25 meq/L (ref 19–32)
Calcium: 9 mg/dL (ref 8.4–10.5)
Chloride: 107 meq/L (ref 96–112)
Creatinine, Ser: 0.85 mg/dL (ref 0.40–1.50)
GFR: 106.19 mL/min (ref >60.00)
Glucose, Bld: 119 mg/dL — ABNORMAL HIGH (ref 70–99)
Potassium: 3.6 meq/L (ref 3.5–5.1)
Sodium: 139 meq/L (ref 135–145)
Total Bilirubin: 0.2 mg/dL (ref 0.2–1.2)
Total Protein: 7.2 g/dL (ref 6.0–8.3)

## 2023-09-17 LAB — LIPID PANEL
Cholesterol: 145 mg/dL (ref 0–200)
HDL: 30.5 mg/dL — ABNORMAL LOW (ref >39.00)
LDL Cholesterol: 69 mg/dL (ref 0–99)
NonHDL: 114.53
Total CHOL/HDL Ratio: 5
Triglycerides: 228 mg/dL — ABNORMAL HIGH (ref 0.0–149.0)
VLDL: 45.6 mg/dL — ABNORMAL HIGH (ref 0.0–40.0)

## 2023-09-17 LAB — HEMOGLOBIN A1C: Hgb A1c MFr Bld: 6.4 % (ref 4.6–6.5)

## 2023-09-17 MED ORDER — NYSTATIN-TRIAMCINOLONE 100000-0.1 UNIT/GM-% EX CREA: 1.0000 | TOPICAL_CREAM | Freq: Two times a day (BID) | CUTANEOUS | 2 refills | Status: DC | PRN

## 2023-09-17 NOTE — Assessment & Plan Note (Signed)
 Nystatin-Triamcinolone cream has helped, continue bid prn, small amount. Monitor for signs of infection. Keeps area dry and use an antibacterial soap.

## 2023-09-17 NOTE — Assessment & Plan Note (Signed)
 HgA1C was 6.4 in 03/2023. Encourage a healthy lifestyle for diabetes prevention. Further recommendation will be given according to hemoglobin A1c result.

## 2023-09-17 NOTE — Assessment & Plan Note (Signed)
 Non pharmacologic treatment recommended for now. Further recommendations will be given according to 10 years CVD risk score and lipid panel numbers.

## 2023-09-17 NOTE — Progress Notes (Signed)
 Chief Complaint  Patient presents with   Follow-up   HPI: Edward Blackburn. is a 43 y.o. male with a PMHx significant for prediabetes, insomnia, and chronic back pain, who is here today for follow up.   -He says his urinary symptoms have resolved since his last visit (08/26/2023) when he was treated  gonorrhea and chlamydia. He states he is rarely using condoms. Completed Doxycycline treatment and received Rocephin in the office.He is not having genital edema or erythema and no urethral discharge.  -He would also like a refill of his nystatin-triamcinolone cream. Medication has helped with inner upper thighs pruritic rash. Problem has been intermittent for some time. Problem exacerbated by heat and sweat. Not tenderness or local edema.  Prediabetes:Negative for polydipsia,polyuria, or polyphagia.  Diet: He says he eats vegetables daily and does not eat many sweets. Admits he eats a lot of salty foods.  Exercise: He doesn't exercise very often.  Lab Results  Component Value Date   HGBA1C 6.4 04/11/2023   Dyslipidemia: He has not been consistent with following low fat diet. Lab Results  Component Value Date   CHOL 147 06/04/2022   HDL 34.40 (L) 06/04/2022   LDLCALC 103 (H) 06/04/2022   TRIG 49.0 06/04/2022   CHOLHDL 4 06/04/2022   Review of Systems  Constitutional:  Negative for activity change, appetite change and fever.  HENT:  Negative for mouth sores and sore throat.   Eyes:  Negative for redness and visual disturbance.  Respiratory:  Negative for cough and shortness of breath.   Cardiovascular:  Negative for chest pain, palpitations and leg swelling.  Gastrointestinal:  Negative for abdominal pain, nausea and vomiting.  Genitourinary:  Negative for decreased urine volume, dysuria, genital sores, hematuria, penile discharge, penile pain, penile swelling, scrotal swelling and testicular pain.  Skin:  Negative for rash.  Neurological:  Negative for dizziness, syncope,  weakness and headaches.  See other pertinent positives and negatives in HPI.  Current Outpatient Medications on File Prior to Visit  Medication Sig Dispense Refill   fluticasone (FLONASE) 50 MCG/ACT nasal spray Place 1 spray into both nostrils 2 (two) times daily. 16 g 0   No current facility-administered medications on file prior to visit.   Past Medical History:  Diagnosis Date   Tobacco abuse disorder    No Known Allergies  Social History   Socioeconomic History   Marital status: Married    Spouse name: Not on file   Number of children: Not on file   Years of education: Not on file   Highest education level: Some college, no degree  Occupational History   Not on file  Tobacco Use   Smoking status: Every Day    Types: Cigarettes   Smokeless tobacco: Never  Vaping Use   Vaping status: Never Used  Substance and Sexual Activity   Alcohol use: Yes   Drug use: Yes    Types: Marijuana   Sexual activity: Not on file  Other Topics Concern   Not on file  Social History Narrative   Not on file   Social Drivers of Health   Financial Resource Strain: Medium Risk (08/23/2023)   Overall Financial Resource Strain (CARDIA)    Difficulty of Paying Living Expenses: Somewhat hard  Food Insecurity: No Food Insecurity (08/23/2023)   Hunger Vital Sign    Worried About Running Out of Food in the Last Year: Never true    Ran Out of Food in the Last Year: Never true  Transportation  Needs: No Transportation Needs (08/23/2023)   PRAPARE - Administrator, Civil Service (Medical): No    Lack of Transportation (Non-Medical): No  Physical Activity: Inactive (11/13/2022)   Exercise Vital Sign    Days of Exercise per Week: 2 days    Minutes of Exercise per Session: 0 min  Stress: Stress Concern Present (08/23/2023)   [employer] of Occupational Health - Occupational Stress Questionnaire    Feeling of Stress : To some extent  Social Connections: Moderately Integrated (08/23/2023)    Social Connection and Isolation Panel [NHANES]    Frequency of Communication with Friends and Family: More than three times a week    Frequency of Social Gatherings with Friends and Family: Three times a week    Attends Religious Services: More than 4 times per year    Active Member of Clubs or Organizations: No    Attends Banker Meetings: Not on file    Marital Status: Married   Vitals:   09/17/23 0728  BP: 120/70  Pulse: 81  Resp: 12  SpO2: 96%   Body mass index is 27.26 kg/m.  Physical Exam Vitals and nursing note reviewed.  Constitutional:      General: He is not in acute distress.    Appearance: He is well-developed.  HENT:     Head: Normocephalic and atraumatic.     Mouth/Throat:     Mouth: Mucous membranes are moist.     Pharynx: Uvula midline.  Eyes:     Conjunctiva/sclera: Conjunctivae normal.  Cardiovascular:     Rate and Rhythm: Normal rate and regular rhythm.     Pulses:          Posterior tibial pulses are 2+ on the right side and 2+ on the left side.     Heart sounds: No murmur heard. Pulmonary:     Effort: Pulmonary effort is normal. No respiratory distress.     Breath sounds: Normal breath sounds.  Abdominal:     Palpations: Abdomen is soft. There is no hepatomegaly or mass.     Tenderness: There is no abdominal tenderness.  Musculoskeletal:     Right lower leg: No edema.     Left lower leg: No edema.  Skin:    General: Skin is warm.     Findings: No erythema or rash.  Neurological:     Mental Status: He is alert and oriented to person, place, and time.     Cranial Nerves: No cranial nerve deficit.     Gait: Gait normal.  Psychiatric:        Mood and Affect: Mood and affect normal.    ASSESSMENT AND PLAN:  [name] was seen today for follow up.   Lab Results  Component Value Date   HGBA1C 6.4 09/17/2023   Lab Results  Component Value Date   NA 139 09/17/2023   CL 107 09/17/2023   K 3.6 09/17/2023   CO2 25 09/17/2023    BUN 10 09/17/2023   CREATININE 0.85 09/17/2023   GFR 106.19 09/17/2023   CALCIUM 9.0 09/17/2023   ALBUMIN 4.3 09/17/2023   GLUCOSE 119 (H) 09/17/2023   Lab Results  Component Value Date   ALT 12 09/17/2023   AST 13 09/17/2023   ALKPHOS 43 09/17/2023   BILITOT 0.2 09/17/2023   Lab Results  Component Value Date   CHOL 145 09/17/2023   HDL 30.50 (L) 09/17/2023   LDLCALC 69 09/17/2023   TRIG 228.0 (H) 09/17/2023  CHOLHDL 5 09/17/2023  The 10-year ASCVD risk score (Arnett DK, et al., 2019) is: 5.9%   Values used to calculate the score:     Age: 17 years     Sex: Male     Is Non-Hispanic African American: Yes     Diabetic: No     Tobacco smoker: Yes     Systolic Blood Pressure: 120 mmHg     Is BP treated: No     HDL Cholesterol: 30.5 mg/dL     Total Cholesterol: 145 mg/dL  Prediabetes Assessment & Plan: HgA1C was 6.4 in 03/2023. Encourage a healthy lifestyle for diabetes prevention. Further recommendation will be given according to hemoglobin A1c result.  Orders: -     Hemoglobin A1c; Future  Dyslipidemia (high LDL; low HDL) Assessment & Plan: Non pharmacologic treatment recommended for now. Further recommendations will be given according to 10 years CVD risk score and lipid panel numbers.  Orders: -     Lipid panel; Future -     Comprehensive metabolic panel; Future  [facility] Assessment & Plan: Nystatin-Triamcinolone cream has helped, continue bid prn, small amount. Monitor for signs of infection. Keeps area dry and use an antibacterial soap.  Orders: -     Nystatin-Triamcinolone; Apply 1 Application topically 2 (two) times daily as needed.  Dispense: 30 g; Refill: 2  Screen for STD (sexually transmitted disease) -     Urine cytology ancillary only; Future  Stressed the importance of using condoms for STD prevention and encouraged safe sex practices. Asymptomatic. Will repeat screening in 2-3 months.  Return in about 1 year (around 09/16/2024) for  CPE. Lab in 2-3 months.Trula Ore, acting as a scribe for Avyanna Spada [place], MD., have documented all relevant documentation on the behalf of Quinnetta Roepke [place], MD, as directed by  Ivionna Verley [place], MD while in the presence of Shavon Zenz [place], MD.   I, Jaxtin Raimondo [place], MD, have reviewed all documentation for this visit. The documentation on 09/17/23 for the exam, diagnosis, procedures, and orders are all accurate and complete.  Mikeala Girdler G. [place], MD  [facility]. [place] office.

## 2023-09-17 NOTE — Patient Instructions (Addendum)
 A few things to remember from today's visit:  Prediabetes - Plan: Hemoglobin A1c  Dyslipidemia (high LDL; low HDL) - Plan: Lipid panel, Comprehensive metabolic panel  Urine test in 2-3 months. Wear condoms always.  If you need refills for medications you take chronically, please call your pharmacy. Do not use My Chart to request refills or for acute issues that need immediate attention. If you send a my chart message, it may take a few days to be addressed, specially if I am not in the office.  Please be sure medication list is accurate. If a new problem present, please set up appointment sooner than planned today.

## 2023-11-17 ENCOUNTER — Ambulatory Visit (INDEPENDENT_AMBULATORY_CARE_PROVIDER_SITE_OTHER): Admitting: Family Medicine

## 2023-11-17 ENCOUNTER — Encounter: Payer: Self-pay | Admitting: Family Medicine

## 2023-11-17 ENCOUNTER — Other Ambulatory Visit (HOSPITAL_COMMUNITY)
Admission: RE | Admit: 2023-11-17 | Discharge: 2023-11-17 | Disposition: A | Discharge status: Home / Self Care | Source: Ambulatory Visit | Attending: Family Medicine | Admitting: Family Medicine

## 2023-11-17 VITALS — BP 120/80 | HR 85 | Resp 12 | Ht 72.0 in | Wt 201.4 lb

## 2023-11-17 DIAGNOSIS — Z202 Contact with and (suspected) exposure to infections with a predominantly sexual mode of transmission: Secondary | ICD-10-CM | POA: Diagnosis not present

## 2023-11-17 DIAGNOSIS — Z113 Encounter for screening for infections with a predominantly sexual mode of transmission: Secondary | ICD-10-CM | POA: Diagnosis present

## 2023-11-17 DIAGNOSIS — F17200 Nicotine dependence, unspecified, uncomplicated: Secondary | ICD-10-CM

## 2023-11-17 MED ORDER — CEFTRIAXONE SODIUM 500 MG IJ SOLR
500.0000 mg | Freq: Once | INTRAMUSCULAR | Status: AC
  Administered 2023-11-17: 500 mg via INTRAMUSCULAR

## 2023-11-17 MED ORDER — NICOTINE 21 MG/24HR TD PT24: 21.0000 mg | MEDICATED_PATCH | Freq: Every day | TRANSDERMAL | 0 refills | Status: AC

## 2023-11-17 NOTE — Progress Notes (Signed)
 ACUTE VISIT Chief Complaint  Patient presents with   std testing   HPI: [name] is a 43 y.o. male with a PMHx significant for prediabetes, insomnia, and chronic back pain, who is here today complaining of exposure to gonorrhea.  Patient states he was informed by his ex-girlfriend that she was diagnosed with gonorrhea.  He is not having any symptoms at this time.  He has been treated for chlamydia and gonorrhea, last treatment with Rocephin  and doxycycline  on 08/26/2023.  Exposure to STD The patient's pertinent negatives include no genital injury, genital itching, genital lesions, pelvic pain, penile discharge, penile pain, scrotal swelling or testicular pain. The current episode started in the past 7 days. The patient is experiencing no pain. Pertinent negatives include no abdominal pain, chest pain, chills, coughing, diarrhea, discolored urine, fever, flank pain, frequency, headaches, hematuria, hesitancy, joint pain, joint swelling, nausea, painful intercourse, rash, shortness of breath, urgency, urinary retention or vomiting. He is sexually active. He inconsistently uses condoms. Yes, his partner has an STD. His past medical history is significant for chlamydia and gonorrhea.   Denies urinary problems or pain, penile discharge, or sore throat.   Smoking:  He is smoking about a half pack per day, and asks about getting a prescription for nicotine  patches.  Negative for cough, wheezing, or dyspnea.  Review of Systems  Constitutional:  Negative for activity change, appetite change, chills and fever.  Respiratory:  Negative for cough and shortness of breath.   Cardiovascular:  Negative for chest pain.  Gastrointestinal:  Negative for abdominal pain, diarrhea, nausea and vomiting.  Genitourinary:  Negative for flank pain, frequency, hesitancy, pelvic pain, penile discharge, penile pain, scrotal swelling, testicular pain and urgency.  Musculoskeletal:  Negative for joint pain.   Skin:  Negative for rash.  Neurological:  Negative for headaches.  See other pertinent positives and negatives in HPI.  Current Outpatient Medications on File Prior to Visit  Medication Sig Dispense Refill   fluticasone  (FLONASE ) 50 MCG/ACT nasal spray Place 1 spray into both nostrils 2 (two) times daily. 16 g 0   nystatin -triamcinolone  (MYCOLOG II) cream Apply 1 Application topically 2 (two) times daily as needed. 30 g 2   No current facility-administered medications on file prior to visit.   Past Medical History:  Diagnosis Date   Tobacco abuse disorder    No Known Allergies  Social History   Socioeconomic History   Marital status: Married    Spouse name: Not on file   Number of children: Not on file   Years of education: Not on file   Highest education level: Some college, no degree  Occupational History   Not on file  Tobacco Use   Smoking status: Every Day    Types: Cigarettes   Smokeless tobacco: Never  Vaping Use   Vaping status: Never Used  Substance and Sexual Activity   Alcohol use: Yes   Drug use: Yes    Types: Marijuana   Sexual activity: Not on file  Other Topics Concern   Not on file  Social History Narrative   Not on file   Social Drivers of Health   Financial Resource Strain: Medium Risk (08/23/2023)   Overall Financial Resource Strain (CARDIA)    Difficulty of Paying Living Expenses: Somewhat hard  Food Insecurity: No Food Insecurity (08/23/2023)   Hunger Vital Sign    Worried About Running Out of Food in the Last Year: Never true    Ran Out of Food in  the Last Year: Never true  Transportation Needs: No Transportation Needs (08/23/2023)   PRAPARE - Administrator, Civil Service (Medical): No    Lack of Transportation (Non-Medical): No  Physical Activity: Inactive (11/13/2022)   Exercise Vital Sign    Days of Exercise per Week: 2 days    Minutes of Exercise per Session: 0 min  Stress: Stress Concern Present (08/23/2023)   [employer] of Occupational Health - Occupational Stress Questionnaire    Feeling of Stress : To some extent  Social Connections: Moderately Integrated (08/23/2023)   Social Connection and Isolation Panel [NHANES]    Frequency of Communication with Friends and Family: More than three times a week    Frequency of Social Gatherings with Friends and Family: Three times a week    Attends Religious Services: More than 4 times per year    Active Member of Clubs or Organizations: No    Attends Banker Meetings: Not on file    Marital Status: Married   Vitals:   11/17/23 0928  BP: 120/80  Pulse: 85  Resp: 12  SpO2: 96%   Body mass index is 27.31 kg/m.  Physical Exam Vitals and nursing note reviewed.  Constitutional:      General: He is not in acute distress.    Appearance: He is well-developed.  HENT:     Head: Normocephalic and atraumatic.     Mouth/Throat:     Mouth: Mucous membranes are moist.     Pharynx: Oropharynx is clear. Uvula midline.  Eyes:     Conjunctiva/sclera: Conjunctivae normal.  Cardiovascular:     Rate and Rhythm: Normal rate and regular rhythm.     Heart sounds: No murmur heard. Pulmonary:     Effort: Pulmonary effort is normal. No respiratory distress.     Breath sounds: Normal breath sounds.  Abdominal:     Palpations: Abdomen is soft. There is no mass.     Tenderness: There is no abdominal tenderness.  Musculoskeletal:     Comments: No signs of synovitis.  Lymphadenopathy:     Cervical: No cervical adenopathy.  Skin:    General: Skin is warm.     Findings: No erythema or rash.  Neurological:     General: No focal deficit present.     Mental Status: He is alert and oriented to person, place, and time.     Gait: Gait normal.  Psychiatric:        Mood and Affect: Affect normal. Mood is anxious.   ASSESSMENT AND PLAN:  Mr. Edward Blackburn was seen today for STD testing.   Screen for STD (sexually transmitted disease) Educated about STD  prevention.  -     HIV Antibody (routine testing w rflx); Future -     RPR; Future -     Urine cytology ancillary only  Exposure to gonorrhea Currently asymptomatic. He has been treated for gonorrhea in the past and tolerated Rocephin  well. Today after verbal consent, he received Rocephin  500 mg IM x 1, discharged after 20 minutes of observation. Further recommendation will be given according to lab results.  -     Urine cytology ancillary only  Tobacco use disorder We discussed benefits of smoking cessation. He is motivated to quit smoking. Nicotine  patches have helped in the past.  -     Nicotine ; Place 1 patch (21 mg total) onto the skin daily.  Dispense: 28 patch; Refill: 0  Return if symptoms worsen or fail to improve.  I, Fritz Jewel Wierda, acting as a scribe for Baylin Cabal [place], MD., have documented all relevant documentation on the behalf of Edward Zwiebel [place], MD, as directed by  Edward Blunck [place], MD while in the presence of Edward Passe [place], MD.   I, Earnestine Tuohey [place], MD, have reviewed all documentation for this visit. The documentation on 11/17/23 for the exam, diagnosis, procedures, and orders are all accurate and complete.  Edward Buda G. [place], MD  [facility]. [place] office.

## 2023-11-17 NOTE — Patient Instructions (Addendum)
 A few things to remember from today's visit:  Screen for STD (sexually transmitted disease) - Plan: HIV Antibody (routine testing w rflx), RPR, Urine cytology ancillary only, CANCELED: Urine cytology ancillary only, CANCELED: Urine cytology ancillary only  Exposure to gonorrhea - Plan: Urine cytology ancillary only  Tobacco use disorder - Plan: nicotine  (NICODERM CQ  - DOSED IN MG/24 HOURS) 21 mg/24hr patch  Rocephin  given today. Will given more recommendations when labs come back.  Do not use My Chart to request refills or for acute issues that need immediate attention. If you send a my chart message, it may take a few days to be addressed, specially if I am not in the office.  Please be sure medication list is accurate. If a new problem present, please set up appointment sooner than planned today.

## 2023-11-18 ENCOUNTER — Encounter: Payer: Self-pay | Admitting: Family Medicine

## 2023-11-18 LAB — URINE CYTOLOGY ANCILLARY ONLY
Chlamydia: NEGATIVE
Comment: NEGATIVE
Comment: NEGATIVE
Comment: NORMAL
Neisseria Gonorrhea: POSITIVE — AB
Trichomonas: NEGATIVE

## 2023-11-18 LAB — RPR: RPR Ser Ql: NONREACTIVE

## 2023-11-18 LAB — HIV ANTIBODY (ROUTINE TESTING W REFLEX): HIV 1&2 Ab, 4th Generation: NONREACTIVE

## 2023-12-16 ENCOUNTER — Encounter: Payer: Self-pay | Admitting: Family Medicine

## 2023-12-16 ENCOUNTER — Ambulatory Visit (INDEPENDENT_AMBULATORY_CARE_PROVIDER_SITE_OTHER): Payer: No Typology Code available for payment source | Admitting: Family Medicine

## 2023-12-16 VITALS — BP 120/80 | HR 79 | Resp 12 | Ht 72.0 in | Wt 202.2 lb

## 2023-12-16 DIAGNOSIS — Z113 Encounter for screening for infections with a predominantly sexual mode of transmission: Secondary | ICD-10-CM | POA: Diagnosis not present

## 2023-12-16 DIAGNOSIS — R7303 Prediabetes: Secondary | ICD-10-CM | POA: Diagnosis not present

## 2023-12-16 DIAGNOSIS — F17200 Nicotine dependence, unspecified, uncomplicated: Secondary | ICD-10-CM | POA: Diagnosis not present

## 2023-12-16 DIAGNOSIS — E785 Hyperlipidemia, unspecified: Secondary | ICD-10-CM | POA: Diagnosis not present

## 2023-12-16 LAB — POCT GLYCOSYLATED HEMOGLOBIN (HGB A1C): HbA1c, POC (prediabetic range): 5.9 % (ref 5.7–6.4)

## 2023-12-16 NOTE — Patient Instructions (Addendum)
 A few things to remember from today's visit:  Prediabetes - Plan: POC HgB A1c  Dyslipidemia (high LDL; low HDL)  Screen for STD (sexually transmitted disease) - Plan: Urine cytology ancillary only  Tobacco use disorder Start nicotine  patches 21 mg for 28 days then go down to 14 mg for 28 days and then 7 mg.  If you need refills for medications you take chronically, please call your pharmacy. Do not use My Chart to request refills or for acute issues that need immediate attention. If you send a my chart message, it may take a few days to be addressed, specially if I am not in the office.  Please be sure medication list is accurate. If a new problem present, please set up appointment sooner than planned today.

## 2023-12-16 NOTE — Assessment & Plan Note (Signed)
 A1c improved, it went from 6.4 to 5.9. Encouraged consistency with a healthy lifestyle for diabetes prevention. We can recheck A1c in 09/2024.

## 2023-12-16 NOTE — Assessment & Plan Note (Signed)
 Motivated to quit smoking, has not started Nicotine  patches. We discussed some adverse effects of tobacco use and a few side effects of nicotine . He can start Nicotine  patches 21 mg x 28 days then 14 mg daily x 28 days and then 7 mg daily x 28 days.

## 2023-12-16 NOTE — Progress Notes (Signed)
 HPI: Edward Blackburn. is a 43 y.o. male with a PMHx significant for prediabetes, insomnia, and chronic back pain, who is here today for chronic disease management.  Last seen on 11/17/2023, when he was treated for gonorrhea.  No longer having any urinary symptoms. Negative for arthralgias, joint edema/erythema,or skin rash.  States he is using condoms all the time.   He says his mood is good.   Smoking cessation:  He has not started using his nicotine  patches today, but asks about how he should start.   Prediabetes:  Diet: He says he does well avoiding sweets but does eat a lot of salt.  Negative for abdominal pain, nausea,vomiting, polydipsia,polyuria, or polyphagia.  Lab Results  Component Value Date   HGBA1C 6.4 09/17/2023   HLD on non pharmacologic treatment. Lab Results  Component Value Date   CHOL 145 09/17/2023   HDL 30.50 (L) 09/17/2023   LDLCALC 69 09/17/2023   TRIG 228.0 (H) 09/17/2023   CHOLHDL 5 09/17/2023   Review of Systems  Constitutional:  Negative for activity change, appetite change and fever.  HENT:  Negative for mouth sores and sore throat.   Respiratory:  Negative for cough, shortness of breath and wheezing.   Cardiovascular:  Negative for chest pain and leg swelling.  Genitourinary:  Negative for decreased urine volume, genital sores, hematuria and testicular pain.  Skin:  Negative for rash.  Neurological:  Negative for syncope and facial asymmetry.  Psychiatric/Behavioral:  Negative for confusion. The patient is not nervous/anxious.   See other pertinent positives and negatives in HPI.  Current Outpatient Medications on File Prior to Visit  Medication Sig Dispense Refill   fluticasone  (FLONASE ) 50 MCG/ACT nasal spray Place 1 spray into both nostrils 2 (two) times daily. 16 g 0   nicotine  (NICODERM CQ  - DOSED IN MG/24 HOURS) 21 mg/24hr patch Place 1 patch (21 mg total) onto the skin daily. 28 patch 0   nystatin -triamcinolone  (MYCOLOG II)  cream Apply 1 Application topically 2 (two) times daily as needed. 30 g 2   No current facility-administered medications on file prior to visit.   Past Medical History:  Diagnosis Date   Tobacco abuse disorder    No Known Allergies  Social History   Socioeconomic History   Marital status: Married    Spouse name: Not on file   Number of children: Not on file   Years of education: Not on file   Highest education level: Some college, no degree  Occupational History   Not on file  Tobacco Use   Smoking status: Every Day    Types: Cigarettes   Smokeless tobacco: Never  Vaping Use   Vaping status: Never Used  Substance and Sexual Activity   Alcohol use: Yes   Drug use: Yes    Types: Marijuana   Sexual activity: Not on file  Other Topics Concern   Not on file  Social History Narrative   Not on file   Social Drivers of Health   Financial Resource Strain: Medium Risk (08/23/2023)   Overall Financial Resource Strain (CARDIA)    Difficulty of Paying Living Expenses: Somewhat hard  Food Insecurity: No Food Insecurity (08/23/2023)   Hunger Vital Sign    Worried About Running Out of Food in the Last Year: Never true    Ran Out of Food in the Last Year: Never true  Transportation Needs: No Transportation Needs (08/23/2023)   PRAPARE - Administrator, Civil Service (Medical): No  Lack of Transportation (Non-Medical): No  Physical Activity: Inactive (11/13/2022)   Exercise Vital Sign    Days of Exercise per Week: 2 days    Minutes of Exercise per Session: 0 min  Stress: Stress Concern Present (08/23/2023)   [employer] of Occupational Health - Occupational Stress Questionnaire    Feeling of Stress : To some extent  Social Connections: Moderately Integrated (08/23/2023)   Social Connection and Isolation Panel [NHANES]    Frequency of Communication with Friends and Family: More than three times a week    Frequency of Social Gatherings with Friends and Family: Three  times a week    Attends Religious Services: More than 4 times per year    Active Member of Clubs or Organizations: No    Attends Banker Meetings: Not on file    Marital Status: Married   Vitals:   12/16/23 0938  BP: 120/80  Pulse: 79  Resp: 12  SpO2: 96%   Body mass index is 27.43 kg/m.  Physical Exam Nursing note reviewed.  Constitutional:      General: He is not in acute distress.    Appearance: He is well-developed.  HENT:     Head: Normocephalic and atraumatic.  Eyes:     Conjunctiva/sclera: Conjunctivae normal.  Cardiovascular:     Rate and Rhythm: Normal rate and regular rhythm.     Pulses:          Dorsalis pedis pulses are 2+ on the right side and 2+ on the left side.     Heart sounds: No murmur heard. Pulmonary:     Effort: Pulmonary effort is normal. No respiratory distress.     Breath sounds: Normal breath sounds.  Abdominal:     Palpations: Abdomen is soft. There is no hepatomegaly or mass.     Tenderness: There is no abdominal tenderness.  Musculoskeletal:     Right lower leg: No edema.     Left lower leg: No edema.  Lymphadenopathy:     Cervical: No cervical adenopathy.  Skin:    General: Skin is warm.     Findings: No erythema or rash.  Neurological:     General: No focal deficit present.     Mental Status: He is alert and oriented to person, place, and time.     Gait: Gait normal.  Psychiatric:        Mood and Affect: Mood and affect normal.   ASSESSMENT AND PLAN:  Mr. Tones was seen today for chronic disease management.   Orders Placed This Encounter  Procedures   POC HgB A1c   Prediabetes Assessment & Plan: A1c improved, it went from 6.4 to 5.9. Encouraged consistency with a healthy lifestyle for diabetes prevention. We can recheck A1c in 09/2024.  Orders: -     POCT glycosylated hemoglobin (Hb A1C)  Dyslipidemia (high LDL; low HDL) Assessment & Plan: We discussed lab results. For now recommend nonpharmacologic  treatment. Fasting lipid panel can be recheck in 10 to 11 months.  Screen for STD (sexually transmitted disease) He has been treated a few times for chlamydia and gonorrhea this year. Currently asymptomatic. We discussed how to prevent STD's and the importance of safe sex practices.  -     Chlamydia/Gonococcus/Trichomonas, NAA  Tobacco use disorder Assessment & Plan: Motivated to quit smoking, has not started Nicotine  patches. We discussed some adverse effects of tobacco use and a few side effects of nicotine . He can start Nicotine  patches 21 mg x 28  days then 14 mg daily x 28 days and then 7 mg daily x 28 days.  Return in about 40 weeks (around 09/21/2024) for CPE, Labs.  I, Fritz Jewel Wierda, acting as a scribe for Devinn Voshell [place], MD., have documented all relevant documentation on the behalf of Jinna Weinman [place], MD, as directed by  Freeland Pracht [place], MD while in the presence of Aden Sek [place], MD.   I, Jules Vidovich [place], MD, have reviewed all documentation for this visit. The documentation on 12/16/23 for the exam, diagnosis, procedures, and orders are all accurate and complete.  Shadie Sweatman G. [place], MD  [facility]. [place] office.

## 2023-12-16 NOTE — Assessment & Plan Note (Signed)
 We discussed lab results. For now recommend nonpharmacologic treatment. Fasting lipid panel can be recheck in 10 to 11 months.

## 2023-12-17 ENCOUNTER — Ambulatory Visit: Payer: Self-pay | Admitting: Family Medicine

## 2023-12-17 LAB — CHLAMYDIA/GONOCOCCUS/TRICHOMONAS, NAA
Chlamydia by NAA: NEGATIVE
Gonococcus by NAA: NEGATIVE
Trich vag by NAA: NEGATIVE

## 2023-12-25 ENCOUNTER — Encounter: Payer: Self-pay | Admitting: Family Medicine

## 2023-12-29 ENCOUNTER — Ambulatory Visit (INDEPENDENT_AMBULATORY_CARE_PROVIDER_SITE_OTHER): Admitting: Family Medicine

## 2023-12-29 ENCOUNTER — Encounter: Payer: Self-pay | Admitting: Family Medicine

## 2023-12-29 VITALS — BP 122/70 | HR 62 | Resp 12 | Ht 72.0 in | Wt 196.0 lb

## 2023-12-29 DIAGNOSIS — H1032 Unspecified acute conjunctivitis, left eye: Secondary | ICD-10-CM | POA: Diagnosis not present

## 2023-12-29 MED ORDER — TOBRADEX 0.3-0.1 % OP OINT: 1.0000 | TOPICAL_OINTMENT | Freq: Two times a day (BID) | OPHTHALMIC | 0 refills | Status: DC

## 2023-12-29 NOTE — Patient Instructions (Addendum)
 A few things to remember from today's visit:  Acute conjunctivitis of left eye, unspecified acute conjunctivitis type - Plan: tobramycin-dexamethasone (TOBRADEX) ophthalmic ointment ? Allergic. Eye ointment 2-3 times daily, small amount for 7 days. Over the counter Zyrtec  10 mg daily for 3-4 weeks then decrease to 5 mg and wean it off. Return for new symptoms.  Do not use My Chart to request refills or for acute issues that need immediate attention. If you send a my chart message, it may take a few days to be addressed, specially if I am not in the office.  Please be sure medication list is accurate. If a new problem present, please set up appointment sooner than planned today.

## 2023-12-29 NOTE — Progress Notes (Signed)
 ACUTE VISIT Chief Complaint  Patient presents with   eye irritation    Left eye, started Thursday    HPI: Edward Blackburn. is a 43 y.o. male with a PMHx significant for prediabetes, insomnia, and chronic back pain, who is here today with his children complaining of left upper eyelid edema, pruritus,and epiphora. Also eye  burning sensation. These symptoms going on since 6/4. He had very slight symptoms that day before, but it was significantly worse by the next day (6/5). Also has had slight redness in his eye.  Today, his eye is a bit better.   He has tried warm and cold compresses, but has not used any eye drops.  He mentions he went to UC on 6/3 for headache, rhinorrhea, nasal congestion, sore throat, nausea,  body aches,and diarrhea. Reports that he tested negative for Covid and Flu. These symptoms continued through yesterday.  Pertinent negatives include visual changes or sick contacts with pink eye.  No recent travel, outdoor exposures,or insect bites. No hx of trauma.  He has not tried OTC medications.  Review of Systems  Constitutional:  Negative for activity change and appetite change.  HENT:  Negative for facial swelling, sinus pain and trouble swallowing.   Respiratory:  Negative for cough and shortness of breath.   Gastrointestinal:  Negative for abdominal pain and vomiting.  Skin:  Negative for rash.  Neurological:  Negative for syncope, facial asymmetry and weakness.  See other pertinent positives and negatives in HPI.  Current Outpatient Medications on File Prior to Visit  Medication Sig Dispense Refill   fluticasone  (FLONASE ) 50 MCG/ACT nasal spray Place 1 spray into both nostrils 2 (two) times daily. 16 g 0   nicotine  (NICODERM CQ  - DOSED IN MG/24 HOURS) 21 mg/24hr patch Place 1 patch (21 mg total) onto the skin daily. 28 patch 0   nystatin -triamcinolone  (MYCOLOG II) cream Apply 1 Application topically 2 (two) times daily as needed. 30 g 2   No current  facility-administered medications on file prior to visit.   Past Medical History:  Diagnosis Date   Tobacco abuse disorder    No Known Allergies  Social History   Socioeconomic History   Marital status: Married    Spouse name: Not on file   Number of children: Not on file   Years of education: Not on file   Highest education level: Some college, no degree  Occupational History   Not on file  Tobacco Use   Smoking status: Every Day    Types: Cigarettes   Smokeless tobacco: Never  Vaping Use   Vaping status: Never Used  Substance and Sexual Activity   Alcohol use: Yes   Drug use: Yes    Types: Marijuana   Sexual activity: Not on file  Other Topics Concern   Not on file  Social History Narrative   Not on file   Social Drivers of Health   Financial Resource Strain: Medium Risk (08/23/2023)   Overall Financial Resource Strain (CARDIA)    Difficulty of Paying Living Expenses: Somewhat hard  Food Insecurity: No Food Insecurity (08/23/2023)   Hunger Vital Sign    Worried About Running Out of Food in the Last Year: Never true    Ran Out of Food in the Last Year: Never true  Transportation Needs: No Transportation Needs (08/23/2023)   PRAPARE - Administrator, Civil Service (Medical): No    Lack of Transportation (Non-Medical): No  Physical Activity: Inactive (11/13/2022)  Exercise Vital Sign    Days of Exercise per Week: 2 days    Minutes of Exercise per Session: 0 min  Stress: Stress Concern Present (08/23/2023)   [employer] of Occupational Health - Occupational Stress Questionnaire    Feeling of Stress : To some extent  Social Connections: Moderately Integrated (08/23/2023)   Social Connection and Isolation Panel [NHANES]    Frequency of Communication with Friends and Family: More than three times a week    Frequency of Social Gatherings with Friends and Family: Three times a week    Attends Religious Services: More than 4 times per year    Active Member of  Clubs or Organizations: No    Attends Banker Meetings: Not on file    Marital Status: Married   Vitals:   12/29/23 1535  BP: 122/70  Pulse: 62  Resp: 12  SpO2: 97%   Body mass index is 26.58 kg/m.  Physical Exam Vitals and nursing note reviewed.  Constitutional:      General: He is not in acute distress.    Appearance: He is well-developed.  HENT:     Head: Normocephalic and atraumatic.     Mouth/Throat:     Mouth: Mucous membranes are moist.     Pharynx: Oropharynx is clear. Uvula midline.  Eyes:     Extraocular Movements: Extraocular movements intact.     Conjunctiva/sclera: Conjunctivae normal.     Left eye: Left conjunctiva is not injected. No exudate or hemorrhage.    Pupils: Pupils are equal, round, and reactive to light.     Comments: Upper eye lid mild edema, no erythema or tenderness. Tarsal conjunctivae left eye with mild erythema. No drainage.  Pulmonary:     Effort: Pulmonary effort is normal. No respiratory distress.  Lymphadenopathy:     Cervical: No cervical adenopathy.  Skin:    General: Skin is warm.     Findings: No erythema or rash.  Neurological:     Mental Status: He is alert and oriented to person, place, and time.     Cranial Nerves: No cranial nerve deficit.     Gait: Gait normal.  Psychiatric:        Mood and Affect: Mood and affect normal.   ASSESSMENT AND PLAN:  Mr. Mcconville was seen today for eye irritation.   Acute conjunctivitis of left eye, unspecified acute conjunctivitis type -     TobraDex; Place 1 Application into the left eye 2 (two) times daily for 7 days.  Dispense: 3.5 g; Refill: 0  We discussed differential Dx. Hx and examination today do not suggest a serious process. He has picture of left eye when symptoms started and seems to be improving. ? Allergic  conjunctivitis. Recommend combination oint with topical steroid and abx to apply 2-3 ties per day, small amount for 7 days. We discussed some side effects  of ophthalmic steroid. Monitor for new symptoms. Continue warm compressions. Instructed about warning signs.  Vision Screening   Right eye Left eye Both eyes  Without correction     With correction 20/20 20/20 20/20    Return if symptoms worsen or fail to improve, for keep next appointment.  I, Edward Blackburn, acting as a scribe for Edward Masoner [place], MD., have documented all relevant documentation on the behalf of Edward Harries [place], MD, as directed by  Edward Wainwright [place], MD while in the presence of Edward Campas [place], MD.   I, Edward Brummitt [place], MD, have reviewed all documentation for this visit. The  documentation on 12/29/23 for the exam, diagnosis, procedures, and orders are all accurate and complete.  Edward Loughner G. [place], MD  [facility]. [place] office.

## 2023-12-30 ENCOUNTER — Other Ambulatory Visit: Payer: Self-pay | Admitting: Family Medicine

## 2023-12-30 ENCOUNTER — Telehealth: Payer: Self-pay

## 2023-12-30 MED ORDER — NEOMYCIN-POLYMYXIN-DEXAMETH 3.5-10000-0.1 OP OINT: 1.0000 | TOPICAL_OINTMENT | Freq: Two times a day (BID) | OPHTHALMIC | 0 refills | Status: AC

## 2023-12-30 NOTE — Telephone Encounter (Signed)
New Rx sent. Thanks, BJ 

## 2023-12-30 NOTE — Telephone Encounter (Signed)
 Copied from CRM [id number]. Topic: Clinical - Medication Question >> Dec 30, 2023 10:34 AM Artemio Larry wrote: Reason for CRM: [facility] called says the tobramycin-dexamethasone (TOBRADEX) ophthalmic ointment is not covered by patient insurance and wants to know if they can precribe the Maxitrol medication instead? Please call [phone] (Work)

## 2024-02-23 ENCOUNTER — Encounter: Payer: Self-pay | Admitting: Family Medicine

## 2024-02-23 ENCOUNTER — Ambulatory Visit (INDEPENDENT_AMBULATORY_CARE_PROVIDER_SITE_OTHER): Admitting: Family Medicine

## 2024-02-23 ENCOUNTER — Other Ambulatory Visit (HOSPITAL_COMMUNITY)
Admission: RE | Admit: 2024-02-23 | Discharge: 2024-02-23 | Disposition: A | Discharge status: Home / Self Care | Source: Ambulatory Visit | Attending: Family Medicine | Admitting: Family Medicine

## 2024-02-23 VITALS — BP 120/70 | HR 82 | Resp 16 | Ht 72.0 in | Wt 196.0 lb

## 2024-02-23 DIAGNOSIS — Z113 Encounter for screening for infections with a predominantly sexual mode of transmission: Secondary | ICD-10-CM

## 2024-02-23 DIAGNOSIS — A5900 Urogenital trichomoniasis, unspecified: Secondary | ICD-10-CM

## 2024-02-23 DIAGNOSIS — L304 Erythema intertrigo: Secondary | ICD-10-CM | POA: Diagnosis not present

## 2024-02-23 MED ORDER — NYSTATIN-TRIAMCINOLONE 100000-0.1 UNIT/GM-% EX CREA: 1.0000 | TOPICAL_CREAM | Freq: Two times a day (BID) | CUTANEOUS | 1 refills | Status: DC | PRN

## 2024-02-23 MED ORDER — NYSTATIN 100000 UNIT/GM EX POWD: 1.0000 | Freq: Three times a day (TID) | CUTANEOUS | 3 refills | Status: AC

## 2024-02-23 NOTE — Patient Instructions (Signed)
 A few things to remember from today's visit:  Intertrigo - Plan: nystatin  (MYCOSTATIN /NYSTOP ) powder, nystatin -triamcinolone  (MYCOLOG II) cream  Screen for STD (sexually transmitted disease) - Plan: Urine cytology ancillary only Continue topica cream, small amount at the time. Add powder to keep area dry. When redness, you can add Desitin cream at night.  If you need refills for medications you take chronically, please call your pharmacy. Do not use My Chart to request refills or for acute issues that need immediate attention. If you send a my chart message, it may take a few days to be addressed, specially if I am not in the office.  Please be sure medication list is accurate. If a new problem present, please set up appointment sooner than planned today.

## 2024-02-23 NOTE — Progress Notes (Signed)
 "  ACUTE VISIT Chief Complaint  Patient presents with   Rash   Discussed the use of AI scribe software for clinical note transcription with the patient, who gave verbal consent to proceed.  History of Present Illness Edward Moodie Sr. is a 43 year old male with PMHx significant for prediabetes, insomnia, and chronic back pain who presents with a chronic/intermittent rash in the groin area.  He has been experiencing a persistent rash in the groin area for approximately a year. The rash initially started on one side and has since spread to both sides. It is described as itchy, particularly at night, and sometimes results in sores due to scratching. No pain or blisters are present unless he scratches the area, which may cause pain.  He has been using nystatin -triamcinolone  cream, which was prescribed previously, but it does not seem to be helping significantly. The cream provides temporary relief, especially at night when the itching is most bothersome.  He denies any urinary problems and has not noticed any masses or genital lesions.   Hx of chlamydia and gonorrhea, he would like to have a urine STD screen. He denies any dysuria or discharge.  He confirms consistent condom use and reports having only one sexual partner, aiming to ensure mutual exclusivity in the relationship.  Review of Systems  Constitutional:  Negative for activity change, appetite change, chills and fever.  Respiratory:  Negative for cough and shortness of breath.   Cardiovascular:  Negative for leg swelling.  Gastrointestinal:  Negative for abdominal pain and nausea.  Genitourinary:  Negative for decreased urine volume, hematuria, penile pain, penile swelling and scrotal swelling.  Skin:  Negative for wound.  Neurological:  Negative for weakness and numbness.  See other pertinent positives and negatives in HPI.  Current Outpatient Medications on File Prior to Visit  Medication Sig Dispense Refill   fluticasone   (FLONASE ) 50 MCG/ACT nasal spray Place 1 spray into both nostrils 2 (two) times daily. 16 g 0   nicotine  (NICODERM CQ  - DOSED IN MG/24 HOURS) 21 mg/24hr patch Place 1 patch (21 mg total) onto the skin daily. 28 patch 0   No current facility-administered medications on file prior to visit.   Past Medical History:  Diagnosis Date   Tobacco abuse disorder    No Known Allergies  Social History   Socioeconomic History   Marital status: Married    Spouse name: Not on file   Number of children: Not on file   Years of education: Not on file   Highest education level: Some college, no degree  Occupational History   Not on file  Tobacco Use   Smoking status: Every Day    Types: Cigarettes   Smokeless tobacco: Never  Vaping Use   Vaping status: Never Used  Substance and Sexual Activity   Alcohol use: Yes   Drug use: Yes    Types: Marijuana   Sexual activity: Not on file  Other Topics Concern   Not on file  Social History Narrative   Not on file   Social Drivers of Health   Financial Resource Strain: Low Risk  (02/19/2024)   Overall Financial Resource Strain (CARDIA)    Difficulty of Paying Living Expenses: Not very hard  Food Insecurity: No Food Insecurity (02/19/2024)   Hunger Vital Sign    Worried About Running Out of Food in the Last Year: Never true    Ran Out of Food in the Last Year: Never true  Transportation Needs: No Transportation  Needs (02/19/2024)   PRAPARE - Administrator, Civil Service (Medical): No    Lack of Transportation (Non-Medical): No  Physical Activity: Inactive (02/19/2024)   Exercise Vital Sign    Days of Exercise per Week: 0 days    Minutes of Exercise per Session: Not on file  Stress: No Stress Concern Present (02/19/2024)   [employer] of Occupational Health - Occupational Stress Questionnaire    Feeling of Stress: Only a little  Social Connections: Moderately Integrated (02/19/2024)   Social Connection and Isolation Panel     Frequency of Communication with Friends and Family: More than three times a week    Frequency of Social Gatherings with Friends and Family: More than three times a week    Attends Religious Services: More than 4 times per year    Active Member of [employer] or Organizations: No    Attends Banker Meetings: Not on file    Marital Status: Married   Vitals:   02/23/24 1339  BP: 120/70  Pulse: 82  Resp: 16  SpO2: 96%   Body mass index is 26.58 kg/m.  Physical Exam Vitals and nursing note reviewed. Exam conducted with a chaperone present.  Constitutional:      General: He is not in acute distress.    Appearance: He is well-developed.  HENT:     Head: Normocephalic and atraumatic.  Eyes:     Conjunctiva/sclera: Conjunctivae normal.  Neck:     Trachea: No tracheal deviation.  Pulmonary:     Effort: Pulmonary effort is normal. No respiratory distress.  Lymphadenopathy:     Lower Body: No right inguinal adenopathy. No left inguinal adenopathy.  Skin:    General: Skin is warm.     Findings: No erythema or rash.     Comments: Post inflammatory pigmentation changes on groin, no erythema or induration.   Neurological:     General: No focal deficit present.     Mental Status: He is alert and oriented to person, place, and time.     Gait: Gait normal.  Psychiatric:        Mood and Affect: Mood and affect normal.    ASSESSMENT AND PLAN: Mr. Edward Stormont Sr. was seen today for rash in groin area.  Intertrigo We discussed diagnosis, prognosis, and treatment options. Problem is not active rash at this time, noted hypopigmentation postinflammatory changes on examination. Recommend to continue nystatin -triamcinolone  cream twice daily, use sparingly. Added  nystatin  powder tid, maintain area dry. Avoid scratching and keep area clean with antibacterial soap and water. Advise desitin at night during acute episodes. Will consider dermatology referral if treatment fails.  -      Nystatin ; Apply 1 Application topically 3 (three) times daily.  Dispense: 60 g; Refill: 3 -     Nystatin -Triamcinolone ; Apply 1 Application topically 2 (two) times daily as needed.  Dispense: 45 g; Refill: 1  Trichomoniasis, urogenital Urine cytology positive for trichomonas, negative for chlamydia and gonorrhea. Sent Rx for metronidazole  500 mg to take bid x 7d. Instructed to tell sex partners to be treated. Condom use encouraged as well as safe sex practices in general. We can re-check in 2-3 months.  Screen for STD (sexually transmitted disease) -     Urine cytology ancillary only  Return if symptoms worsen or fail to improve, for keep next appointment.  I, Vernell Forest, acting as a scribe for Hervey Wedig, MD., have documented all relevant documentation on the behalf of Azaliyah Kennard,  MD, as directed by   while in the presence of Robson Trickey, MD.  I, Rodert Hinch, MD, have reviewed all documentation for this visit. The documentation on 02/25/24 for the exam, diagnosis, procedures, and orders are all accurate and complete.  Seng Fouts G. Kaityln Kallstrom, MD  [facility]. [place] office.   "

## 2024-02-24 LAB — URINE CYTOLOGY ANCILLARY ONLY
Chlamydia: NEGATIVE
Comment: NEGATIVE
Comment: NEGATIVE
Comment: NORMAL
Neisseria Gonorrhea: NEGATIVE
Trichomonas: POSITIVE — AB

## 2024-02-25 ENCOUNTER — Ambulatory Visit: Payer: Self-pay | Admitting: Family Medicine

## 2024-02-25 ENCOUNTER — Encounter: Payer: Self-pay | Admitting: Family Medicine

## 2024-02-25 MED ORDER — METRONIDAZOLE 500 MG PO TABS: 500.0000 mg | ORAL_TABLET | Freq: Two times a day (BID) | ORAL | 0 refills | Status: AC

## 2024-07-31 ENCOUNTER — Telehealth: Admitting: Nurse Practitioner

## 2024-07-31 DIAGNOSIS — J069 Acute upper respiratory infection, unspecified: Secondary | ICD-10-CM | POA: Diagnosis not present

## 2024-07-31 MED ORDER — IBUPROFEN 800 MG PO TABS: 800.0000 mg | ORAL_TABLET | Freq: Three times a day (TID) | ORAL | 0 refills | Status: AC | PRN

## 2024-07-31 MED ORDER — PROMETHAZINE-DM 6.25-15 MG/5ML PO SYRP: 5.0000 mL | ORAL_SOLUTION | Freq: Four times a day (QID) | ORAL | 0 refills | Status: DC | PRN

## 2024-07-31 MED ORDER — ONDANSETRON 4 MG PO TBDP: 4.0000 mg | ORAL_TABLET | Freq: Three times a day (TID) | ORAL | 0 refills | Status: AC | PRN

## 2024-07-31 NOTE — Progress Notes (Signed)
 " Virtual Visit Consent   Edward Quintela Sr., you are scheduled for a virtual visit with a  provider today. Just as with appointments in the office, your consent must be obtained to participate. Your consent will be active for this visit and any virtual visit you may have with one of our providers in the next 365 days. If you have a MyChart account, a copy of this consent can be sent to you electronically.  As this is a virtual visit, video technology does not allow for your provider to perform a traditional examination. This may limit your provider's ability to fully assess your condition. If your provider identifies any concerns that need to be evaluated in person or the need to arrange testing (such as labs, EKG, etc.), we will make arrangements to do so. Although advances in technology are sophisticated, we cannot ensure that it will always work on either your end or our end. If the connection with a video visit is poor, the visit may have to be switched to a telephone visit. With either a video or telephone visit, we are not always able to ensure that we have a secure connection.  By engaging in this virtual visit, you consent to the provision of healthcare and authorize for your insurance to be billed (if applicable) for the services provided during this visit. Depending on your insurance coverage, you may receive a charge related to this service.  I need to obtain your verbal consent now. Are you willing to proceed with your visit today? Edward Lamb Sr. has provided verbal consent on 07/31/2024 for a virtual visit (video or telephone). Edward LELON Servant, NP  Date: 07/31/2024 4:39 PM   Virtual Visit via Video Note   I, Edward Blackburn, connected with  Edward Edelson Sr.  ([id number], Aug 14, 1980) on 07/31/2024 at  4:30 PM EST by a video-enabled telemedicine application and verified that I am speaking with the correct person using two identifiers.  Location: Patient: Virtual Visit Location  Patient: Home Provider: Virtual Visit Location Provider: Home Office   I discussed the limitations of evaluation and management by telemedicine and the availability of in person appointments. The patient expressed understanding and agreed to proceed.    History of Present Illness: Edward Markell Sr. is a 44 y.o. who identifies as a male who was assigned male at birth, and is being seen today for URI with cough and congestion.   Over the past 24 hours Edward Blackburn has been experiencing symptoms of chills, fever with Tmax 103, vomiting, back pain, headaches, malaise and myalgias. He has not taken an over-the-counter flu or COVID test.  OTC medications tried: Tylenol  and Dayquil.    Problems:  Patient Active Problem List   Diagnosis Date Noted   Dyslipidemia (high LDL; low HDL) 09/17/2023   Intertrigo 09/17/2023   Prediabetes 06/04/2022   Insomnia 06/04/2022   Tinea pedis of both feet 06/04/2022   Routine general medical examination at a health care facility 06/04/2022   Tobacco use disorder 06/04/2022   Numbness and tingling of both lower extremities 08/24/2019   Chronic low back pain 08/24/2019   Headache, unspecified headache type 08/24/2019    Allergies: Allergies[1] Medications: Current Medications[2]  Observations/Objective: Patient is well-developed, well-nourished in no acute distress.  Resting comfortably at home.  Head is normocephalic, atraumatic.  No labored breathing.  Speech is clear and coherent with logical content.  Patient is alert and oriented at baseline.    Assessment and Plan: 1. URI with  cough and congestion (Primary) - promethazine -dextromethorphan (PROMETHAZINE -DM) 6.25-15 MG/5ML syrup; Take 5 mLs by mouth 4 (four) times daily as needed.  Dispense: 240 mL; Refill: 0 - ibuprofen  (ADVIL ) 800 MG tablet; Take 1 tablet (800 mg total) by mouth every 8 (eight) hours as needed.  Dispense: 30 tablet; Refill: 0 - ondansetron  (ZOFRAN -ODT) 4 MG disintegrating tablet;  Take 1 tablet (4 mg total) by mouth every 8 (eight) hours as needed for nausea or vomiting. Place in cheek to dissolve or under tongue  Dispense: 21 tablet; Refill: 0  Hey hey  Follow Up Instructions: I discussed the assessment and treatment plan with the patient. The patient was provided an opportunity to ask questions and all were answered. The patient agreed with the plan and demonstrated an understanding of the instructions.  A copy of instructions were sent to the patient via MyChart unless otherwise noted below.     The patient was advised to call back or seek an in-person evaluation if the symptoms worsen or if the condition fails to improve as anticipated.    Edward LELON Servant, NP     [1] No Known Allergies [2]  Current Outpatient Medications:    ibuprofen  (ADVIL ) 800 MG tablet, Take 1 tablet (800 mg total) by mouth every 8 (eight) hours as needed., Disp: 30 tablet, Rfl: 0   ondansetron  (ZOFRAN -ODT) 4 MG disintegrating tablet, Take 1 tablet (4 mg total) by mouth every 8 (eight) hours as needed for nausea or vomiting. Place in cheek to dissolve or under tongue, Disp: 21 tablet, Rfl: 0   promethazine -dextromethorphan (PROMETHAZINE -DM) 6.25-15 MG/5ML syrup, Take 5 mLs by mouth 4 (four) times daily as needed., Disp: 240 mL, Rfl: 0   fluticasone  (FLONASE ) 50 MCG/ACT nasal spray, Place 1 spray into both nostrils 2 (two) times daily., Disp: 16 g, Rfl: 0   nicotine  (NICODERM CQ  - DOSED IN MG/24 HOURS) 21 mg/24hr patch, Place 1 patch (21 mg total) onto the skin daily., Disp: 28 patch, Rfl: 0   nystatin  (MYCOSTATIN /NYSTOP ) powder, Apply 1 Application topically 3 (three) times daily., Disp: 60 g, Rfl: 3   nystatin -triamcinolone  (MYCOLOG II) cream, Apply 1 Application topically 2 (two) times daily as needed., Disp: 45 g, Rfl: 1  "

## 2024-07-31 NOTE — Patient Instructions (Signed)
 " Edward Kitty Sr., thank you for joining Haze LELON Servant, NP for today's virtual visit.  While this provider is not your primary care provider (PCP), if your PCP is located in our provider database this encounter information will be shared with them immediately following your visit.   A [facility] MyChart account gives you access to today's visit and all your visits, tests, and labs performed at [facility]  click here if you don't have a [facility] MyChart account or go to [email or web address]  Consent: (Patient) Edward Kitty Sr. provided verbal consent for this virtual visit at the beginning of the encounter.  Current Medications:  Current Outpatient Medications:    ibuprofen  (ADVIL ) 800 MG tablet, Take 1 tablet (800 mg total) by mouth every 8 (eight) hours as needed., Disp: 30 tablet, Rfl: 0   ondansetron  (ZOFRAN -ODT) 4 MG disintegrating tablet, Take 1 tablet (4 mg total) by mouth every 8 (eight) hours as needed for nausea or vomiting. Place in cheek to dissolve or under tongue, Disp: 21 tablet, Rfl: 0   promethazine -dextromethorphan (PROMETHAZINE -DM) 6.25-15 MG/5ML syrup, Take 5 mLs by mouth 4 (four) times daily as needed., Disp: 240 mL, Rfl: 0   fluticasone  (FLONASE ) 50 MCG/ACT nasal spray, Place 1 spray into both nostrils 2 (two) times daily., Disp: 16 g, Rfl: 0   nicotine  (NICODERM CQ  - DOSED IN MG/24 HOURS) 21 mg/24hr patch, Place 1 patch (21 mg total) onto the skin daily., Disp: 28 patch, Rfl: 0   nystatin  (MYCOSTATIN /NYSTOP ) powder, Apply 1 Application topically 3 (three) times daily., Disp: 60 g, Rfl: 3   nystatin -triamcinolone  (MYCOLOG II) cream, Apply 1 Application topically 2 (two) times daily as needed., Disp: 45 g, Rfl: 1   Medications ordered in this encounter:  Meds ordered this encounter  Medications   promethazine -dextromethorphan (PROMETHAZINE -DM) 6.25-15 MG/5ML syrup    Sig: Take 5 mLs by mouth 4 (four) times daily as needed.    Dispense:  240 mL     Refill:  0    Supervising Provider:   BLAISE ALEENE KIDD [id number]   ibuprofen  (ADVIL ) 800 MG tablet    Sig: Take 1 tablet (800 mg total) by mouth every 8 (eight) hours as needed.    Dispense:  30 tablet    Refill:  0    Supervising Provider:   LAMPTEY, PHILIP O [[id number]]   ondansetron  (ZOFRAN -ODT) 4 MG disintegrating tablet    Sig: Take 1 tablet (4 mg total) by mouth every 8 (eight) hours as needed for nausea or vomiting. Place in cheek to dissolve or under tongue    Dispense:  21 tablet    Refill:  0    Supervising Provider:   BLAISE ALEENE KIDD [[id number]]     *If you need refills on other medications prior to your next appointment, please contact your pharmacy*  Follow-Up: Call back or seek an in-person evaluation if the symptoms worsen or if the condition fails to improve as anticipated.  [facility] [phone]  Other Instructions Recommend taking an over-the-counter flu or COVID test   If you have been instructed to have an in-person evaluation today at a local Urgent Care facility, please use the link below. It will take you to a list of all of our available [facility], including address, phone number and hours of operation. Please do not delay care.  [facility]  If you or a family member do not have a primary care provider, use the link  below to schedule a visit and establish care. When you choose a [facility] primary care physician or advanced practice provider, you gain a long-term partner in health. Find a Primary Care Provider  Learn more about [facility]'s in-office and virtual care options: [facility] - Get Care Now  "

## 2024-08-01 ENCOUNTER — Telehealth: Admitting: Physician Assistant

## 2024-08-01 DIAGNOSIS — J111 Influenza due to unidentified influenza virus with other respiratory manifestations: Secondary | ICD-10-CM | POA: Diagnosis not present

## 2024-08-01 MED ORDER — ONDANSETRON 4 MG PO TBDP: 4.0000 mg | ORAL_TABLET | Freq: Three times a day (TID) | ORAL | 0 refills | Status: DC | PRN

## 2024-08-01 MED ORDER — NAPROXEN 500 MG PO TABS: 500.0000 mg | ORAL_TABLET | Freq: Two times a day (BID) | ORAL | 0 refills | Status: AC

## 2024-08-01 MED ORDER — OSELTAMIVIR PHOSPHATE 75 MG PO CAPS: 75.0000 mg | ORAL_CAPSULE | Freq: Two times a day (BID) | ORAL | 0 refills | Status: AC

## 2024-08-01 NOTE — Progress Notes (Signed)
 " Virtual Visit Consent   Edward Martelle Sr., you are scheduled for a virtual visit with a [facility] provider today. Just as with appointments in the office, your consent must be obtained to participate. Your consent will be active for this visit and any virtual visit you may have with one of our providers in the next 365 days. If you have a MyChart account, a copy of this consent can be sent to you electronically.  As this is a virtual visit, video technology does not allow for your provider to perform a traditional examination. This may limit your provider's ability to fully assess your condition. If your provider identifies any concerns that need to be evaluated in person or the need to arrange testing (such as labs, EKG, etc.), we will make arrangements to do so. Although advances in technology are sophisticated, we cannot ensure that it will always work on either your end or our end. If the connection with a video visit is poor, the visit may have to be switched to a telephone visit. With either a video or telephone visit, we are not always able to ensure that we have a secure connection.  By engaging in this virtual visit, you consent to the provision of healthcare and authorize for your insurance to be billed (if applicable) for the services provided during this visit. Depending on your insurance coverage, you may receive a charge related to this service.  I need to obtain your verbal consent now. Are you willing to proceed with your visit today? Edward Basquez Sr. has provided verbal consent on 08/01/2024 for a virtual visit (video or telephone). Edward Blackburn, [place]  Date: 08/01/2024 3:58 PM   Virtual Visit via Video Note   I, Edward Blackburn, connected with  Edward Denault Sr.  ([id number], 02-22-1981) on 08/01/2024 at  3:45 PM EST by a video-enabled telemedicine application and verified that I am speaking with the correct person using two identifiers.  Location: Patient: Virtual Visit Location  Patient: Home Provider: Virtual Visit Location Provider: Home Office   I discussed the limitations of evaluation and management by telemedicine and the availability of in person appointments. The patient expressed understanding and agreed to proceed.    History of Present Illness: Edward Lewallen Sr. is a 44 y.o. who identifies as a male who was assigned male at birth, and is being seen today for flu symptoms.  HPI: Influenza This is a new problem. The current episode started today. The problem occurs constantly. The problem has been gradually improving. Associated symptoms include arthralgias, chills, diaphoresis, fatigue, a fever and nausea. Nothing aggravates the symptoms. He has tried drinking and acetaminophen  for the symptoms. The treatment provided mild relief.    Problems:  Patient Active Problem List   Diagnosis Date Noted   Dyslipidemia (high LDL; low HDL) 09/17/2023   Intertrigo 09/17/2023   Prediabetes 06/04/2022   Insomnia 06/04/2022   Tinea pedis of both feet 06/04/2022   Routine general medical examination at a health care facility 06/04/2022   Tobacco use disorder 06/04/2022   Numbness and tingling of both lower extremities 08/24/2019   Chronic low back pain 08/24/2019   Headache, unspecified headache type 08/24/2019    Allergies: Allergies[1] Medications: Current Medications[2]  Observations/Objective: Patient is well-developed, well-nourished in no acute distress.  Resting comfortably  at home.  Head is normocephalic, atraumatic.  No labored breathing.  Speech is clear and coherent with logical content.  Patient is alert and oriented at baseline.    Assessment  and Plan: 1. Influenza (Primary)  +home test Influenza felt to be likely cause of symptoms in patient presenting within 48 hours of onset of symptoms with antiviral treatment felt to be indicated after discussion of potential benefits and risks with the patient and felt to be indicated.  Prior to  discharge, we discussed ER  precautions, specifically for symptoms suggestive of bacterial co-infection or worsening illness, and recommended follow up with primary care provider within 2-3 days or ER and the patient demonstrated understanding and agreement with this plan.  Follow Up Instructions: I discussed the assessment and treatment plan with the patient. The patient was provided an opportunity to ask questions and all were answered. The patient agreed with the plan and demonstrated an understanding of the instructions.  A copy of instructions were sent to the patient via MyChart unless otherwise noted below.     The patient was advised to call back or seek an in-person evaluation if the symptoms worsen or if the condition fails to improve as anticipated.    Edward Lightsey, PA-C     [1] No Known Allergies [2]  Current Outpatient Medications:    fluticasone  (FLONASE ) 50 MCG/ACT nasal spray, Place 1 spray into both nostrils 2 (two) times daily., Disp: 16 g, Rfl: 0   ibuprofen  (ADVIL ) 800 MG tablet, Take 1 tablet (800 mg total) by mouth every 8 (eight) hours as needed., Disp: 30 tablet, Rfl: 0   nicotine  (NICODERM CQ  - DOSED IN MG/24 HOURS) 21 mg/24hr patch, Place 1 patch (21 mg total) onto the skin daily., Disp: 28 patch, Rfl: 0   nystatin  (MYCOSTATIN /NYSTOP ) powder, Apply 1 Application topically 3 (three) times daily., Disp: 60 g, Rfl: 3   nystatin -triamcinolone  (MYCOLOG II) cream, Apply 1 Application topically 2 (two) times daily as needed., Disp: 45 g, Rfl: 1   ondansetron  (ZOFRAN -ODT) 4 MG disintegrating tablet, Take 1 tablet (4 mg total) by mouth every 8 (eight) hours as needed for nausea or vomiting. Place in cheek to dissolve or under tongue, Disp: 21 tablet, Rfl: 0   promethazine -dextromethorphan (PROMETHAZINE -DM) 6.25-15 MG/5ML syrup, Take 5 mLs by mouth 4 (four) times daily as needed., Disp: 240 mL, Rfl: 0  "

## 2024-08-01 NOTE — Patient Instructions (Signed)
" °  Edward Kitty Sr., thank you for joining [name], PA-C for today's virtual visit.  While this provider is not your primary care provider (PCP), if your PCP is located in our provider database this encounter information will be shared with them immediately following your visit.   A [facility] MyChart account gives you access to today's visit and all your visits, tests, and labs performed at [facility]  click here if you don't have a [facility] MyChart account or go to [email or web address]  Consent: (Patient) Edward Kitty Sr. provided verbal consent for this virtual visit at the beginning of the encounter.  Current Medications:  Current Outpatient Medications:    fluticasone  (FLONASE ) 50 MCG/ACT nasal spray, Place 1 spray into both nostrils 2 (two) times daily., Disp: 16 g, Rfl: 0   ibuprofen  (ADVIL ) 800 MG tablet, Take 1 tablet (800 mg total) by mouth every 8 (eight) hours as needed., Disp: 30 tablet, Rfl: 0   nicotine  (NICODERM CQ  - DOSED IN MG/24 HOURS) 21 mg/24hr patch, Place 1 patch (21 mg total) onto the skin daily., Disp: 28 patch, Rfl: 0   nystatin  (MYCOSTATIN /NYSTOP ) powder, Apply 1 Application topically 3 (three) times daily., Disp: 60 g, Rfl: 3   nystatin -triamcinolone  (MYCOLOG II) cream, Apply 1 Application topically 2 (two) times daily as needed., Disp: 45 g, Rfl: 1   ondansetron  (ZOFRAN -ODT) 4 MG disintegrating tablet, Take 1 tablet (4 mg total) by mouth every 8 (eight) hours as needed for nausea or vomiting. Place in cheek to dissolve or under tongue, Disp: 21 tablet, Rfl: 0   promethazine -dextromethorphan (PROMETHAZINE -DM) 6.25-15 MG/5ML syrup, Take 5 mLs by mouth 4 (four) times daily as needed., Disp: 240 mL, Rfl: 0   Medications ordered in this encounter:  No orders of the defined types were placed in this encounter.    *If you need refills on other medications prior to your next appointment, please contact your pharmacy*  Follow-Up: Call back or  seek an in-person evaluation if the symptoms worsen or if the condition fails to improve as anticipated.  [facility] [phone]  Other Instructions Follow up with primary provider in 24-48 hours. Report to nearest ER with any worsening symptoms.    If you have been instructed to have an in-person evaluation today at a local Urgent Care facility, please use the link below. It will take you to a list of all of our available [facility], including address, phone number and hours of operation. Please do not delay care.  [facility]  If you or a family member do not have a primary care provider, use the link below to schedule a visit and establish care. When you choose a [facility] primary care physician or advanced practice provider, you gain a long-term partner in health. Find a Primary Care Provider  Learn more about [facility] in-office and virtual care options: [facility] - Get Care Now  "

## 2024-08-11 ENCOUNTER — Encounter: Payer: Self-pay | Admitting: Family Medicine

## 2024-08-11 ENCOUNTER — Ambulatory Visit: Admitting: Family Medicine

## 2024-08-11 ENCOUNTER — Other Ambulatory Visit (HOSPITAL_COMMUNITY)
Admission: RE | Admit: 2024-08-11 | Discharge: 2024-08-11 | Disposition: A | Source: Ambulatory Visit | Attending: Family Medicine | Admitting: Family Medicine

## 2024-08-11 VITALS — BP 122/82 | HR 89 | Temp 98.0°F | Resp 16 | Ht 73.0 in | Wt 198.8 lb

## 2024-08-11 DIAGNOSIS — L304 Erythema intertrigo: Secondary | ICD-10-CM | POA: Diagnosis not present

## 2024-08-11 DIAGNOSIS — M25511 Pain in right shoulder: Secondary | ICD-10-CM | POA: Diagnosis not present

## 2024-08-11 DIAGNOSIS — Z113 Encounter for screening for infections with a predominantly sexual mode of transmission: Secondary | ICD-10-CM | POA: Diagnosis present

## 2024-08-11 DIAGNOSIS — L299 Pruritus, unspecified: Secondary | ICD-10-CM | POA: Diagnosis not present

## 2024-08-11 DIAGNOSIS — G8929 Other chronic pain: Secondary | ICD-10-CM | POA: Diagnosis not present

## 2024-08-11 MED ORDER — KETOCONAZOLE 2 % EX CREA: 1.0000 | TOPICAL_CREAM | Freq: Every day | CUTANEOUS | 0 refills | Status: AC

## 2024-08-11 MED ORDER — FLUCONAZOLE 150 MG PO TABS: 150.0000 mg | ORAL_TABLET | ORAL | 0 refills | Status: AC

## 2024-08-11 NOTE — Patient Instructions (Addendum)
 A few things to remember from today's visit:  Chronic right shoulder pain - Plan: Ambulatory referral to Sports Medicine  Pruritus of skin - Plan: Ambulatory referral to Dermatology  Intertrigo - Plan: Ambulatory referral to Dermatology, ketoconazole  (NIZORAL ) 2 % cream, fluconazole  (DIFLUCAN ) 150 MG tablet  Screen for STD (sexually transmitted disease) - Plan: Urine cytology ancillary only  If you need refills for medications you take chronically, please call your pharmacy. Do not use My Chart to request refills or for acute issues that need immediate attention. If you send a my chart message, it may take a few days to be addressed, specially if I am not in the office.  Please be sure medication list is accurate. If a new problem present, please set up appointment sooner than planned today.

## 2024-08-11 NOTE — Progress Notes (Signed)
 "  ACUTE VISIT Chief Complaint  Patient presents with   Arm Pain    Rigth arm pain x 1 month - cortisone shot helped for awhile    Rash    Medication given has not helped - growing and itching more often    Discussed the use of AI scribe software for clinical note transcription with the patient, who gave verbal consent to proceed.  History of Present Illness Edward Merkel Sr. is a 44 year old male with a PMHx significant for prediabetes, insomnia, and chronic back pain who presents with arm pain and a persistent rash as described above. These problems are not new.  -He has been experiencing right arm pain for the past month, localized to the lateral right shoulder area. The pain intensity ranges from 7 to 10 out of 10, depending on arm position.  He has seen sport medicine for similar pain and resolved after subacromial steroid injection in December 2024 initially alleviated the pain, but it has since returned. There is no history of recent trauma or injury to the area.  He is not currently taking any medication for the pain, although he had a prescription for ibuprofen  last week for a headache associated with the flu that he has taken.  -He also has a persistent rash in the groin area, present for approximately a year. The rash began on one side and has spread to both sides, characterized by intense itching, especially at night, and sometimes resulting in painful sores from scratching. His thighs have turned very dark in color on area previously affected by rash.  He has used nystatin -triamcinolone  cream, but reports that it did not help. No genital lesions or penile drainage are reported. The pruritus is primarily nocturnal. He also mentions that he has a history of skin tags on neck, removed by a dermatologist, which have recurred.  He is requesting STD screening. Hx of gonorrhea and chlamydia. He has been wearing condoms.  Review of Systems  Constitutional:  Negative for activity change,  appetite change, chills and fever.  HENT:  Negative for mouth sores and sore throat.   Gastrointestinal:  Negative for abdominal pain, nausea and vomiting.  Genitourinary:  Negative for decreased urine volume, dysuria, genital sores, hematuria, penile discharge, penile pain and scrotal swelling.  Musculoskeletal:  Negative for joint swelling and neck pain.  Skin:  Positive for rash.  Neurological:  Negative for syncope, weakness and headaches.  Psychiatric/Behavioral:  Negative for confusion and hallucinations.   See other pertinent positives and negatives in HPI.  Medications Ordered Prior to Encounter[1]  Past Medical History:  Diagnosis Date   Tobacco abuse disorder    Allergies[2]  Social History   Socioeconomic History   Marital status: Married    Spouse name: Not on file   Number of children: Not on file   Years of education: Not on file   Highest education level: Some college, no degree  Occupational History   Not on file  Tobacco Use   Smoking status: Former    Types: Cigarettes   Smokeless tobacco: Former  Building Services Engineer status: Never Used  Substance and Sexual Activity   Alcohol use: Yes   Drug use: Yes    Types: Marijuana   Sexual activity: Not on file  Other Topics Concern   Not on file  Social History Narrative   Not on file   Social Drivers of Health   Tobacco Use: Medium Risk (08/11/2024)   Patient History  Smoking Tobacco Use: Former    Smokeless Tobacco Use: Former    Passive Exposure: Not on Actuary Strain: Low Risk (02/19/2024)   Overall Financial Resource Strain (CARDIA)    Difficulty of Paying Living Expenses: Not very hard  Food Insecurity: No Food Insecurity (02/19/2024)   Epic    Worried About Programme Researcher, Broadcasting/film/video in the Last Year: Never true    Ran Out of Food in the Last Year: Never true  Transportation Needs: No Transportation Needs (02/19/2024)   Epic    Lack of Transportation (Medical): No    Lack of  Transportation (Non-Medical): No  Physical Activity: Inactive (02/19/2024)   Exercise Vital Sign    Days of Exercise per Week: 0 days    Minutes of Exercise per Session: Not on file  Stress: No Stress Concern Present (02/19/2024)   [employer] of Occupational Health - Occupational Stress Questionnaire    Feeling of Stress: Only a little  Social Connections: Moderately Integrated (02/19/2024)   Social Connection and Isolation Panel    Frequency of Communication with Friends and Family: More than three times a week    Frequency of Social Gatherings with Friends and Family: More than three times a week    Attends Religious Services: More than 4 times per year    Active Member of Clubs or Organizations: No    Attends Banker Meetings: Not on file    Marital Status: Married  Depression (PHQ2-9): Low Risk (12/16/2023)   Depression (PHQ2-9)    PHQ-2 Score: 0  Alcohol Screen: Low Risk (02/19/2024)   Alcohol Screen    Last Alcohol Screening Score (AUDIT): 4  Housing: High Risk (02/19/2024)   Epic    Unable to Pay for Housing in the Last Year: Yes    Number of Times Moved in the Last Year: 0    Homeless in the Last Year: No  Utilities: Not on file  Health Literacy: Not on file    Vitals:   08/11/24 1438  BP: 122/82  Pulse: 89  Resp: 16  Temp: 98 F (36.7 C)  SpO2: 97%   Body mass index is 26.23 kg/m.  Physical Exam Vitals and nursing note reviewed.  Constitutional:      General: He is not in acute distress.    Appearance: He is well-developed.  HENT:     Head: Normocephalic and atraumatic.     Mouth/Throat:     Mouth: Mucous membranes are moist.     Pharynx: Oropharynx is clear.  Eyes:     Conjunctiva/sclera: Conjunctivae normal.  Cardiovascular:     Rate and Rhythm: Normal rate and regular rhythm.  Pulmonary:     Effort: Pulmonary effort is normal. No respiratory distress.     Breath sounds: Normal breath sounds.  Musculoskeletal:     Right shoulder:  Normal range of motion.       Arms:     Comments: Right shoulder: No deformity, edema, or erythema appreciated.No muscle atrophy. Hawkins' test Neg, drop arm rotator cuff test neg, empty can supraspinatus test neg, lift-Off Subscapularis test negative.  Lymphadenopathy:     Cervical: No cervical adenopathy.     Lower Body: No right inguinal adenopathy. No left inguinal adenopathy.  Skin:    Findings: No erythema or rash.     Comments: Hyperpigmented pediculated,soft,not tender lesion under chin, not tender, x 2, 3-4 mm.  Inguinal area with pigmentation changes. No erythema, tenderness,or induration.  Neurological:  General: No focal deficit present.     Mental Status: He is alert and oriented to person, place, and time.     Gait: Gait normal.  Psychiatric:        Mood and Affect: Mood and affect normal.    ASSESSMENT AND PLAN:  Mr. Edward Blackburn was seen today for arm pain and rash.  Diagnoses and all orders for this visit:  Orders Placed This Encounter  Procedures   Ambulatory referral to Sports Medicine   Ambulatory referral to Dermatology   Chronic right shoulder pain He has seen Sport Medicine for similar symptoms. Taking Ibuprofen , which was prescribed for headache (flu related) and has not helped. Pain is reported as severe. I do not think imaging is needed today. Referral to sport med placed.  -     Ambulatory referral to Sports Medicine  Pruritus of skin -     Ambulatory referral to Dermatology  Intertrigo Assessment & Plan: We discussed differential Dx. Explained that this problem can be recurrent. Topical treatment with Mycolog has not helped, so discontinued. Today I did not appreciate erythema but rather postinflammatory pigmentation changes. Try Ketoconazole  cream and Diflucan  150 mg weeks x 2. Keep area lean with soap and water, avoid scratching,and keep area dry. Continue following with dermatologist.  Orders: -     Ambulatory referral to  Dermatology -     Ketoconazole ; Apply 1 Application topically daily.  Dispense: 15 g; Refill: 0 -     Fluconazole ; Take 1 tablet (150 mg total) by mouth once a week for 2 doses.  Dispense: 2 tablet; Refill: 0  Screen for STD (sexually transmitted disease) -     Urine cytology ancillary only  Return if symptoms worsen or fail to improve.  Meshawn Oconnor G. Lismary Kiehn, MD  [facility]. [place] office.    [1]  Current Outpatient Medications on File Prior to Visit  Medication Sig Dispense Refill   fluticasone  (FLONASE ) 50 MCG/ACT nasal spray Place 1 spray into both nostrils 2 (two) times daily. 16 g 0   ibuprofen  (ADVIL ) 800 MG tablet Take 1 tablet (800 mg total) by mouth every 8 (eight) hours as needed. 30 tablet 0   naproxen  (NAPROSYN ) 500 MG tablet Take 1 tablet (500 mg total) by mouth 2 (two) times daily with a meal. 30 tablet 0   nicotine  (NICODERM CQ  - DOSED IN MG/24 HOURS) 21 mg/24hr patch Place 1 patch (21 mg total) onto the skin daily. 28 patch 0   nystatin  (MYCOSTATIN /NYSTOP ) powder Apply 1 Application topically 3 (three) times daily. 60 g 3   nystatin -triamcinolone  (MYCOLOG II) cream Apply 1 Application topically 2 (two) times daily as needed. 45 g 1   ondansetron  (ZOFRAN -ODT) 4 MG disintegrating tablet Take 1 tablet (4 mg total) by mouth every 8 (eight) hours as needed for nausea or vomiting. Place in cheek to dissolve or under tongue 21 tablet 0   No current facility-administered medications on file prior to visit.  [2] No Known Allergies  "

## 2024-08-12 LAB — URINE CYTOLOGY ANCILLARY ONLY
Chlamydia: NEGATIVE
Comment: NEGATIVE
Comment: NEGATIVE
Comment: NORMAL
Neisseria Gonorrhea: NEGATIVE
Trichomonas: NEGATIVE

## 2024-08-13 ENCOUNTER — Ambulatory Visit: Payer: Self-pay | Admitting: Family Medicine

## 2024-08-15 ENCOUNTER — Encounter: Payer: Self-pay | Admitting: Family Medicine

## 2024-08-15 NOTE — Assessment & Plan Note (Signed)
 We discussed differential Dx. Explained that this problem can be recurrent. Topical treatment with Mycolog has not helped, so discontinued. Today I did not appreciate erythema but rather postinflammatory pigmentation changes. Try Ketoconazole  cream and Diflucan  150 mg weeks x 2. Keep area lean with soap and water, avoid scratching,and keep area dry. Continue following with dermatologist.

## 2024-08-24 ENCOUNTER — Ambulatory Visit: Admitting: Family Medicine

## 2025-05-02 ENCOUNTER — Ambulatory Visit: Admitting: Physician Assistant
